# Patient Record
Sex: Female | Born: 1990 | Race: Black or African American | Hispanic: No | Marital: Single | Smoking: Never smoker
Health system: Southern US, Community
[De-identification: ages and names within clinical notes are randomized; demographics above are authoritative.]

## PROBLEM LIST (undated history)

## (undated) ENCOUNTER — Inpatient Hospital Stay (HOSPITAL_COMMUNITY): Payer: Self-pay

## (undated) DIAGNOSIS — Z789 Other specified health status: Secondary | ICD-10-CM

## (undated) DIAGNOSIS — O480 Post-term pregnancy: Secondary | ICD-10-CM

## (undated) DIAGNOSIS — O368390 Maternal care for abnormalities of the fetal heart rate or rhythm, unspecified trimester, not applicable or unspecified: Secondary | ICD-10-CM

## (undated) HISTORY — PX: INDUCED ABORTION: SHX677

## (undated) HISTORY — DX: Maternal care for abnormalities of the fetal heart rate or rhythm, unspecified trimester, not applicable or unspecified: O36.8390

## (undated) HISTORY — DX: Post-term pregnancy: O48.0

## (undated) HISTORY — PX: MOUTH SURGERY: SHX715

---
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
## 2012-12-28 ENCOUNTER — Encounter (HOSPITAL_COMMUNITY): Payer: Self-pay | Admitting: Emergency Medicine

## 2012-12-28 ENCOUNTER — Emergency Department (INDEPENDENT_AMBULATORY_CARE_PROVIDER_SITE_OTHER)
Admission: EM | Admit: 2012-12-28 | Discharge: 2012-12-28 | Disposition: A | Discharge status: Home / Self Care | Payer: BC Managed Care – PPO | Source: Home / Self Care | Attending: Family Medicine | Admitting: Family Medicine

## 2012-12-28 DIAGNOSIS — N946 Dysmenorrhea, unspecified: Secondary | ICD-10-CM

## 2012-12-28 LAB — POCT URINALYSIS DIP (DEVICE)
Bilirubin Urine: NEGATIVE
Hgb urine dipstick: NEGATIVE
Nitrite: NEGATIVE
pH: 5.5 (ref 5.0–8.0)

## 2012-12-28 LAB — POCT PREGNANCY, URINE: Preg Test, Ur: NEGATIVE

## 2012-12-28 NOTE — ED Provider Notes (Signed)
Brooke Mullen is a 22 y.o. female who presents to Urgent Care today for one week of cramping intermittent abdominal pain. Her pains are similar to menstrual cramps. She had a elective abortion about one month ago. She had normal 2 week followup visit. She denies any vaginal discharge or bleeding. She denies any dysuria nausea vomiting or diarrhea. She feels well otherwise. The pain is mild to moderate. She's tried over-the-counter pain medications which have helped a bit.    History reviewed. No pertinent past medical history. History  Substance Use Topics  . Smoking status: Never Smoker   . Smokeless tobacco: Not on file  . Alcohol Use: Yes   ROS as above Medications reviewed. No current facility-administered medications for this encounter.   No current outpatient prescriptions on file.    Exam:  BP 113/80  Pulse 66  Temp(Src) 97.6 F (36.4 C) (Oral)  Resp 16  SpO2 100% Gen: Well NAD HEENT: EOMI,  MMM Lungs: CTABL Nl WOB Heart: RRR no MRG Abd: NABS, NT, ND Exts: Non edematous BL  LE, warm and well perfused.   Results for orders placed during the hospital encounter of 12/28/12 (from the past 24 hour(s))  POCT URINALYSIS DIP (DEVICE)     Status: None   Collection Time    12/28/12  3:50 PM      Result Value Range   Glucose, UA NEGATIVE  NEGATIVE mg/dL   Bilirubin Urine NEGATIVE  NEGATIVE   Ketones, ur NEGATIVE  NEGATIVE mg/dL   Specific Gravity, Urine >=1.030  1.005 - 1.030   Hgb urine dipstick NEGATIVE  NEGATIVE   pH 5.5  5.0 - 8.0   Protein, ur NEGATIVE  NEGATIVE mg/dL   Urobilinogen, UA 0.2  0.0 - 1.0 mg/dL   Nitrite NEGATIVE  NEGATIVE   Leukocytes, UA NEGATIVE  NEGATIVE  POCT PREGNANCY, URINE     Status: None   Collection Time    12/28/12  3:56 PM      Result Value Range   Preg Test, Ur NEGATIVE  NEGATIVE   No results found.  Assessment and Plan: 22 y.o. female with this is likely menstrual cramping following her abortion.  She appears to be well.  Plan to  use NSAIDs for pain control. If her symptoms persist she should followup at [facility] for pelvic ultrasound to look for retained products of conception.  Discussed warning signs or symptoms. Please see discharge instructions. Patient expresses understanding.      Rodolph Bong, MD 12/28/12 [phone]

## 2012-12-28 NOTE — ED Notes (Signed)
Pt c/o lower abd pain onset 1 week Sxs inlucde: intermittent pain that increases w/pressure Reports she just had a recent abortion Denies: inj/trauma, urinary sxs, vag d/c, fevers Alert w/no signs of acute distress.

## 2013-02-01 ENCOUNTER — Other Ambulatory Visit (HOSPITAL_COMMUNITY)
Admission: RE | Admit: 2013-02-01 | Discharge: 2013-02-01 | Disposition: A | Discharge status: Home / Self Care | Payer: BC Managed Care – PPO | Source: Ambulatory Visit | Attending: Family Medicine | Admitting: Family Medicine

## 2013-02-01 ENCOUNTER — Encounter (HOSPITAL_COMMUNITY): Payer: Self-pay | Admitting: Emergency Medicine

## 2013-02-01 ENCOUNTER — Emergency Department (INDEPENDENT_AMBULATORY_CARE_PROVIDER_SITE_OTHER)
Admission: EM | Admit: 2013-02-01 | Discharge: 2013-02-01 | Disposition: A | Discharge status: Home / Self Care | Payer: BC Managed Care – PPO | Source: Home / Self Care | Attending: Family Medicine | Admitting: Family Medicine

## 2013-02-01 DIAGNOSIS — Z113 Encounter for screening for infections with a predominantly sexual mode of transmission: Secondary | ICD-10-CM | POA: Insufficient documentation

## 2013-02-01 DIAGNOSIS — N760 Acute vaginitis: Secondary | ICD-10-CM | POA: Insufficient documentation

## 2013-02-01 DIAGNOSIS — N730 Acute parametritis and pelvic cellulitis: Secondary | ICD-10-CM

## 2013-02-01 LAB — POCT URINALYSIS DIP (DEVICE)
Bilirubin Urine: NEGATIVE
Nitrite: NEGATIVE
Specific Gravity, Urine: >=1.03 (ref 1.005–1.030)
pH: 6.5 (ref 5.0–8.0)

## 2013-02-01 MED ORDER — LIDOCAINE HCL (PF) 1 % IJ SOLN
INTRAMUSCULAR | Status: AC
  Filled 2013-02-01: qty 5

## 2013-02-01 MED ORDER — METRONIDAZOLE 500 MG PO TABS: 500.0000 mg | ORAL_TABLET | Freq: Two times a day (BID) | ORAL | Status: DC

## 2013-02-01 MED ORDER — DOXYCYCLINE HYCLATE 100 MG PO CAPS: 100.0000 mg | ORAL_CAPSULE | Freq: Two times a day (BID) | ORAL | Status: DC

## 2013-02-01 MED ORDER — CEFTRIAXONE SODIUM 1 G IJ SOLR
1.0000 g | Freq: Once | INTRAMUSCULAR | Status: AC
  Administered 2013-02-01: 1 g via INTRAMUSCULAR

## 2013-02-01 MED ORDER — CEFTRIAXONE SODIUM 1 G IJ SOLR
INTRAMUSCULAR | Status: AC
  Filled 2013-02-01: qty 10

## 2013-02-01 NOTE — ED Notes (Signed)
C/o lower pelvic pain x 1wk. States pain with intercourse. Pt also states that on Sunday she noticed blood when urinating.  Possible concerns of std's. No otc meds tried for symptoms. Hx of abortion 9/23.

## 2013-02-01 NOTE — ED Provider Notes (Signed)
CSN: [id number]     Arrival date & time 02/01/13  1558 History   None    Chief Complaint  Patient presents with  . Pelvic Pain    lower pelvic pain x 1wk and pain with intercourse.    (Consider location/radiation/quality/duration/timing/severity/associated sxs/prior Treatment) HPI Comments: 22 year old female presents complaining of vaginal pain, pelvic pain, pain with intercourse, vaginal discharge, hematuria, dysuria. She believe she may have an STD. She also notes that she just had an abortion recently 2 months ago and doesn't know what that could be she reaching. She denies fever, chills, NVD, abdominal pain.  Patient is a 22 y.o. female presenting with pelvic pain.  Pelvic Pain Pertinent negatives include no chest pain, no abdominal pain and no shortness of breath.    History reviewed. No pertinent past medical history. Past Surgical History  Procedure Laterality Date  . Induced abortion     History reviewed. No pertinent family history. History  Substance Use Topics  . Smoking status: Never Smoker   . Smokeless tobacco: Not on file  . Alcohol Use: Yes   OB History   Grav Para Term Preterm Abortions TAB SAB Ect Mult Living                 Review of Systems  Constitutional: Negative for fever and chills.  Eyes: Negative for visual disturbance.  Respiratory: Negative for cough and shortness of breath.   Cardiovascular: Negative for chest pain, palpitations and leg swelling.  Gastrointestinal: Negative for nausea, vomiting and abdominal pain.  Endocrine: Negative for polydipsia and polyuria.  Genitourinary: Positive for dysuria, urgency, frequency, hematuria, vaginal bleeding, vaginal discharge, vaginal pain, pelvic pain and dyspareunia. Negative for flank pain and genital sores.  Musculoskeletal: Negative for arthralgias and myalgias.  Skin: Negative for rash.  Neurological: Negative for dizziness, weakness and light-headedness.    Allergies  Review of patient's  allergies indicates no known allergies.  Home Medications   Current Outpatient Rx  Name  Route  Sig  Dispense  Refill  . doxycycline (VIBRAMYCIN) 100 MG capsule   Oral   Take 1 capsule (100 mg total) by mouth 2 (two) times daily.   28 capsule   0   . metroNIDAZOLE (FLAGYL) 500 MG tablet   Oral   Take 1 tablet (500 mg total) by mouth 2 (two) times daily.   28 tablet   0    BP 125/82  Pulse 63  Temp(Src) 99 F (37.2 C) (Oral)  Resp 17  SpO2 99% Physical Exam  Nursing note and vitals reviewed. Constitutional: She is oriented to person, place, and time. Vital signs are normal. She appears well-developed and well-nourished. No distress.  HENT:  Head: Normocephalic and atraumatic.  Pulmonary/Chest: Effort normal. No respiratory distress.  Abdominal: Soft. She exhibits no mass. There is no tenderness. There is no rebound and no guarding.  Genitourinary: Cervix exhibits motion tenderness. Cervix exhibits no discharge. There is bleeding around the vagina. Vaginal discharge found.  Neurological: She is alert and oriented to person, place, and time. She has normal strength. Coordination normal.  Skin: Skin is warm and dry. No rash noted. She is not diaphoretic.  Psychiatric: She has a normal mood and affect. Judgment normal.    ED Course  Procedures (including critical care time) Labs Review Labs Reviewed  POCT URINALYSIS DIP (DEVICE) - Abnormal; Notable for the following:    Hgb urine dipstick LARGE (*)    Protein, ur 30 (*)    Leukocytes, UA TRACE (*)  All other components within normal limits  URINE CULTURE  RPR  HIV ANTIBODY (ROUTINE TESTING)  POCT PREGNANCY, URINE  CERVICOVAGINAL ANCILLARY ONLY   Imaging Review No results found.    MDM   1. PID (acute pelvic inflammatory disease)    Vaginal pain and cervical motion tenderness, treating for pelvic inflammatory disease. Sending swabs. Followup immediately if any worsening. Sending RPR and HIV as well.     Meds ordered this encounter  Medications  . cefTRIAXone (ROCEPHIN) injection 1 g    Sig:   . metroNIDAZOLE (FLAGYL) 500 MG tablet    Sig: Take 1 tablet (500 mg total) by mouth 2 (two) times daily.    Dispense:  28 tablet    Refill:  0    Order Specific Question:  Supervising Provider    Answer:  Linna Hoff [phone]  . doxycycline (VIBRAMYCIN) 100 MG capsule    Sig: Take 1 capsule (100 mg total) by mouth 2 (two) times daily.    Dispense:  28 capsule    Refill:  0    Order Specific Question:  Supervising Provider    Answer:  Bradd Canary D [[id number]]       Graylon Good, PA-C 02/01/13 1851

## 2013-02-01 NOTE — Discharge Instructions (Signed)
Pelvic Inflammatory Disease °Pelvic inflammatory disease (PID) refers to an infection in some or all of the female organs. The infection can be in the uterus, ovaries, fallopian tubes, or the surrounding tissues in the pelvis. PID can cause abdominal or pelvic pain that comes on suddenly (acute pelvic pain). PID is a serious infection because it can lead to lasting (chronic) pelvic pain or the inability to have children (infertile).  °CAUSES  °The infection is often caused by the normal bacteria found in the vaginal tissues. PID may also be caused by an infection that is spread during sexual contact. PID can also occur following:  °· The birth of a baby.   °· A miscarriage.   °· An abortion.   °· Major pelvic surgery.   °· The use of an intrauterine device (IUD).   °· A sexual assault.   °RISK FACTORS °Certain factors can put a person at higher risk for PID, such as: °· Being younger than 25 years. °· Being sexually active at a young age. °· Using nonbarrier contraception. °· Having multiple sexual partners. °· Having sex with someone who has symptoms of a genital infection. °· Using oral contraception. °Other times, certain behaviors can increase the possibility of getting PID, such as: °· Having sex during your period. °· Using a vaginal douche. °· Having an intrauterine device (IUD) in place. °SYMPTOMS  °· Abdominal or pelvic pain.   °· Fever.   °· Chills.   °· Abnormal vaginal discharge. °· Abnormal uterine bleeding.   °· Unusual pain shortly after finishing your period. °DIAGNOSIS  °Your caregiver will choose some of the following methods to make a diagnosis, such as:  °· Performing a physical exam and history. A pelvic exam typically reveals a very tender uterus and surrounding pelvis.   °· Ordering laboratory tests including a pregnancy test, blood tests, and urine test.  °· Ordering cultures of the vagina and cervix to check for a sexually transmitted infection (STI). °· Performing an ultrasound.    °· Performing a laparoscopic procedure to look inside the pelvis.   °TREATMENT  °· Antibiotic medicines may be prescribed and taken by mouth.   °· Sexual partners may be treated when the infection is caused by a sexually transmitted disease (STD).   °· Hospitalization may be needed to give antibiotics intravenously. °· Surgery may be needed, but this is rare. °It may take weeks until you are completely well. If you are diagnosed with PID, you should also be checked for human immunodeficiency virus (HIV).   °HOME CARE INSTRUCTIONS  °· If given, take your antibiotics as directed. Finish the medicine even if you start to feel better.   °· Only take over-the-counter or prescription medicines for pain, discomfort, or fever as directed by your caregiver.   °· Do not have sexual intercourse until treatment is completed or as directed by your caregiver. If PID is confirmed, your recent sexual partner(s) will need treatment.   °· Keep your follow-up appointments. °SEEK MEDICAL CARE IF:  °· You have increased or abnormal vaginal discharge.   °· You need prescription medicine for your pain.   °· You vomit.   °· You cannot take your medicines.   °· Your partner has an STD.   °SEEK IMMEDIATE MEDICAL CARE IF:  °· You have a fever.   °· You have increased abdominal or pelvic pain.   °· You have chills.   °· You have pain when you urinate.   °· You are not better after 72 hours following treatment.   °MAKE SURE YOU:  °· Understand these instructions. °· Will watch your condition. °· Will get help right away if you are not doing well or get worse. °  pelvic pain.    You have chills.    You have pain when you urinate.    You are not better after 72 hours following treatment.   MAKE SURE YOU:    Understand these instructions.   Will watch your condition.   Will get help right away if you are not doing well or get worse.  Document Released: 03/24/2005 Document Revised: 12/17/2011 Document Reviewed: 03/20/2011  [employer] Patient Information 2014 [employer].

## 2013-02-02 LAB — HIV ANTIBODY (ROUTINE TESTING W REFLEX): HIV: NONREACTIVE

## 2013-02-03 NOTE — ED Provider Notes (Signed)
Medical screening examination/treatment/procedure(s) were performed by a resident physician or non-physician practitioner and as the supervising physician I was immediately available for consultation/collaboration.  Saren Corkern, MD    Amman Bartel S Tamkia Temples, MD 02/03/13 0756 

## 2013-02-04 NOTE — ED Notes (Signed)
Review labs

## 2013-02-08 NOTE — ED Notes (Signed)
GC/Chlamydia neg., Affirm: Candida and Trich neg., Gardnerella pos., HIV/RPR non-reactive. Pt. Adequately treated with Flagyl. Vassie Moselle 02/08/2013

## 2013-03-08 ENCOUNTER — Inpatient Hospital Stay (HOSPITAL_COMMUNITY): Payer: BC Managed Care – PPO

## 2013-03-08 ENCOUNTER — Encounter (HOSPITAL_COMMUNITY): Payer: Self-pay | Admitting: Emergency Medicine

## 2013-03-08 ENCOUNTER — Inpatient Hospital Stay (HOSPITAL_COMMUNITY)
Admission: AD | Admit: 2013-03-08 | Discharge: 2013-03-09 | Disposition: A | Discharge status: Home / Self Care | Payer: BC Managed Care – PPO | Source: Ambulatory Visit | Attending: Obstetrics & Gynecology | Admitting: Obstetrics & Gynecology

## 2013-03-08 ENCOUNTER — Emergency Department (INDEPENDENT_AMBULATORY_CARE_PROVIDER_SITE_OTHER)
Admission: EM | Admit: 2013-03-08 | Discharge: 2013-03-08 | Disposition: A | Discharge status: Home / Self Care | Payer: BC Managed Care – PPO | Source: Home / Self Care | Attending: Emergency Medicine | Admitting: Emergency Medicine

## 2013-03-08 ENCOUNTER — Encounter (HOSPITAL_COMMUNITY): Payer: Self-pay | Admitting: *Deleted

## 2013-03-08 DIAGNOSIS — R52 Pain, unspecified: Secondary | ICD-10-CM

## 2013-03-08 DIAGNOSIS — R109 Unspecified abdominal pain: Secondary | ICD-10-CM | POA: Insufficient documentation

## 2013-03-08 DIAGNOSIS — Z331 Pregnant state, incidental: Secondary | ICD-10-CM

## 2013-03-08 DIAGNOSIS — O26899 Other specified pregnancy related conditions, unspecified trimester: Secondary | ICD-10-CM

## 2013-03-08 DIAGNOSIS — Z3201 Encounter for pregnancy test, result positive: Secondary | ICD-10-CM

## 2013-03-08 DIAGNOSIS — Z3490 Encounter for supervision of normal pregnancy, unspecified, unspecified trimester: Secondary | ICD-10-CM

## 2013-03-08 DIAGNOSIS — O99891 Other specified diseases and conditions complicating pregnancy: Secondary | ICD-10-CM | POA: Insufficient documentation

## 2013-03-08 HISTORY — DX: Other specified health status: Z78.9

## 2013-03-08 LAB — POCT URINALYSIS DIP (DEVICE)
Bilirubin Urine: NEGATIVE
Glucose, UA: NEGATIVE mg/dL
Ketones, ur: NEGATIVE mg/dL
Protein, ur: NEGATIVE mg/dL
Specific Gravity, Urine: 1.025 (ref 1.005–1.030)

## 2013-03-08 LAB — POCT PREGNANCY, URINE: Preg Test, Ur: POSITIVE — AB

## 2013-03-08 LAB — CBC
Hemoglobin: 12.9 g/dL (ref 12.0–15.0)
MCH: 32 pg (ref 26.0–34.0)
MCV: 87.8 fL (ref 78.0–100.0)
Platelets: 184 10*3/uL (ref 150–400)
RBC: 4.03 MIL/uL (ref 3.87–5.11)

## 2013-03-08 LAB — HCG, QUANTITATIVE, PREGNANCY: hCG, Beta Chain, Quant, S: 1604 m[IU]/mL — ABNORMAL HIGH (ref <5)

## 2013-03-08 LAB — WET PREP, GENITAL

## 2013-03-08 MED ORDER — ONDANSETRON 4 MG PO TBDP
ORAL_TABLET | ORAL | Status: AC
  Filled 2013-03-08: qty 1

## 2013-03-08 MED ORDER — SODIUM CHLORIDE 0.9 % IV SOLN: Freq: Once | INTRAVENOUS | Status: DC

## 2013-03-08 MED ORDER — ONDANSETRON 4 MG PO TBDP
4.0000 mg | ORAL_TABLET | Freq: Once | ORAL | Status: AC
  Administered 2013-03-08: 4 mg via ORAL

## 2013-03-08 NOTE — ED Provider Notes (Signed)
Medical screening examination/treatment/procedure(s) were performed by non-physician practitioner and as supervising physician I was immediately available for consultation/collaboration.  Leslee Home, M.D.  Reuben Likes, MD 03/08/13 2219

## 2013-03-08 NOTE — ED Provider Notes (Signed)
CSN: [id number]     Arrival date & time 03/08/13  1935 History   None    Chief Complaint  Patient presents with  . Abdominal Cramping   (Consider location/radiation/quality/duration/timing/severity/associated sxs/prior Treatment) HPI Comments: Patient presents to UC with complaints of 24 hours of lower abdominal pain with associated cramping. Denies urinary symptoms, vaginal bleeding or discharge. LNMP: 3rd week in Oct. 2014. G1P0A1. Denies N/V/D/C or fever.   The history is provided by the patient.    History reviewed. No pertinent past medical history. Past Surgical History  Procedure Laterality Date  . Induced abortion     History reviewed. No pertinent family history. History  Substance Use Topics  . Smoking status: Never Smoker   . Smokeless tobacco: Not on file  . Alcohol Use: Yes   OB History   Grav Para Term Preterm Abortions TAB SAB Ect Mult Living                 Review of Systems  All other systems reviewed and are negative.    Allergies  Review of patient's allergies indicates no known allergies.  Home Medications   Current Outpatient Rx  Name  Route  Sig  Dispense  Refill  . doxycycline (VIBRAMYCIN) 100 MG capsule   Oral   Take 1 capsule (100 mg total) by mouth 2 (two) times daily.   28 capsule   0   . metroNIDAZOLE (FLAGYL) 500 MG tablet   Oral   Take 1 tablet (500 mg total) by mouth 2 (two) times daily.   28 tablet   0    BP 93/53  Pulse 80  Temp(Src) 97.8 F (36.6 C) (Oral)  Resp 16  SpO2 100%  LMP 02/03/2013 Physical Exam  Nursing note and vitals reviewed. Constitutional: She is oriented to person, place, and time. She appears well-developed and well-nourished. No distress.  Eyes: Conjunctivae are normal. No scleral icterus.  Cardiovascular: Normal rate, regular rhythm and normal heart sounds.   Pulmonary/Chest: Effort normal and breath sounds normal.  Abdominal: Soft. Bowel sounds are normal. She exhibits no distension and no mass.  There is tenderness. There is no rebound and no guarding.  Musculoskeletal: Normal range of motion.  Neurological: She is alert and oriented to person, place, and time.  Skin: Skin is warm and dry.  Psychiatric: She has a normal mood and affect. Her behavior is normal.    ED Course  Procedures (including critical care time) Labs Review Labs Reviewed  POCT PREGNANCY, URINE - Abnormal; Notable for the following:    Preg Test, Ur POSITIVE (*)    All other components within normal limits  POCT URINALYSIS DIP (DEVICE)   Imaging Review No results found.  EKG Interpretation    Date/Time:    Ventricular Rate:    PR Interval:    QRS Duration:   QT Interval:    QTC Calculation:   R Axis:     Text Interpretation:              MDM   1. Pregnant   2. Abdominal pain, acute     UA normal. UPT positive. Case discussed with Dr. Lorenz Coaster and patient to be transferred to [facility] for evaluation for ectopic pregnancy.    Jess Barters [place], [place] 03/08/13 2106

## 2013-03-08 NOTE — MAU Note (Signed)
Pt states she has been having abdominal cramping on Saturday pain and cramping came back.

## 2013-03-08 NOTE — ED Notes (Signed)
Reports lower abdominal cramping on set yesterday.  States missed cycle.  Usually has normal cycle. Mild nausea no vomiting. Denies abnormal discharge and bleeding.  Pt has used advil with no relief.

## 2013-03-08 NOTE — MAU Provider Note (Signed)
Chief Complaint: Abdominal Pain   First Provider Initiated Contact with Patient 03/08/13 2158     SUBJECTIVE HPI: Brooke Mullen is a 22 y.o. G2P0010 at [redacted]w[redacted]d by LMP who presents to maternity admissions sent from Urgent Care with abdominal cramping x3 days and positive pregnancy test.  She denies vaginal bleeding, vaginal itching/burning, urinary symptoms, h/a, dizziness, n/v, or fever/chills.    Patient's last menstrual period was 02/03/2013. She is fairly sure of this date and has regular cycles.    Past Medical History  Diagnosis Date  . Medical history non-contributory    Past Surgical History  Procedure Laterality Date  . Induced abortion    . Mouth surgery     History   Social History  . Marital Status: Single    Spouse Name: N/A    Number of Children: N/A  . Years of Education: N/A   Occupational History  . Not on file.   Social History Main Topics  . Smoking status: Never Smoker   . Smokeless tobacco: Not on file  . Alcohol Use: Yes  . Drug Use: No  . Sexual Activity: Yes    Birth Control/ Protection: None   Other Topics Concern  . Not on file   Social History Narrative  . No narrative on file   No current facility-administered medications on file prior to encounter.   No current outpatient prescriptions on file prior to encounter.   No Known Allergies  ROS: Pertinent items in HPI  OBJECTIVE Temperature 98.3 F (36.8 C), temperature source Oral, resp. rate 16, last menstrual period 02/03/2013, SpO2 100.00%. GENERAL: Well-developed, well-nourished female in no acute distress.  HEENT: Normocephalic HEART: normal rate RESP: normal effort ABDOMEN: Soft, non-tender EXTREMITIES: Nontender, no edema NEURO: Alert and oriented SPECULUM EXAM:   LAB RESULTS Results for orders placed during the hospital encounter of 03/08/13 (from the past 24 hour(s))  WET PREP, GENITAL     Status: Abnormal   Collection Time    03/08/13 10:20 PM      Result Value Range    Yeast Wet Prep HPF POC NONE SEEN  NONE SEEN   Trich, Wet Prep NONE SEEN  NONE SEEN   Clue Cells Wet Prep HPF POC NONE SEEN  NONE SEEN   WBC, Wet Prep HPF POC FEW (*) NONE SEEN  HCG, QUANTITATIVE, PREGNANCY     Status: Abnormal   Collection Time    03/08/13 10:55 PM      Result Value Range   hCG, Beta Chain, Quant, S 1604 (*) <5 mIU/mL  CBC     Status: Abnormal   Collection Time    03/08/13 10:55 PM      Result Value Range   WBC 9.5  4.0 - 10.5 K/uL   RBC 4.03  3.87 - 5.11 MIL/uL   Hemoglobin 12.9  12.0 - 15.0 g/dL   HCT 40.9 (*) 81.1 - 91.4 %   MCV 87.8  78.0 - 100.0 fL   MCH 32.0  26.0 - 34.0 pg   MCHC 36.4 (*) 30.0 - 36.0 g/dL   RDW 78.2  95.6 - 21.3 %   Platelets 184  150 - 400 K/uL    IMAGING US Ob Comp Less 14 Wks  03/08/2013   CLINICAL DATA:  Pelvic pain, cramping  EXAM: OBSTETRIC <14 WK [place] AND TRANSVAGINAL OB US  TECHNIQUE: Both transabdominal and transvaginal ultrasound examinations were performed for complete evaluation of the gestation as well as the maternal uterus, adnexal regions, and  pelvic cul-de-sac. Transvaginal technique was performed to assess early pregnancy.  COMPARISON:  None.  FINDINGS: Intrauterine gestational sac: Visualized/normal in shape.  Yolk sac:  Not identified  Embryo:  Not identified  Cardiac Activity: Not applicable  MSD: 3.6 mm   4 w   6  d  [place] EDC: 11/09/2013  Maternal uterus/adnexae: No subchorionic hemorrhage. Grossly normal sonographic appearance to the ovaries. Corpus luteal cyst on the right. No free fluid.  IMPRESSION: Single intrauterine gestation. No embryo at this time, likely due to the early age of the pregnancy. Estimated age of 4 weeks 6 days by mean sac diameter. Recommend ultrasound follow-up.   Electronically Signed   By: Jearld Lesch M.D.   On: 03/08/2013 23:51   US Ob Transvaginal  03/08/2013   CLINICAL DATA:  Pelvic pain, cramping  EXAM: OBSTETRIC <14 WK [place] AND TRANSVAGINAL OB US  TECHNIQUE: Both transabdominal and  transvaginal ultrasound examinations were performed for complete evaluation of the gestation as well as the maternal uterus, adnexal regions, and pelvic cul-de-sac. Transvaginal technique was performed to assess early pregnancy.  COMPARISON:  None.  FINDINGS: Intrauterine gestational sac: Visualized/normal in shape.  Yolk sac:  Not identified  Embryo:  Not identified  Cardiac Activity: Not applicable  MSD: 3.6 mm   4 w   6  d  [place] EDC: 11/09/2013  Maternal uterus/adnexae: No subchorionic hemorrhage. Grossly normal sonographic appearance to the ovaries. Corpus luteal cyst on the right. No free fluid.  IMPRESSION: Single intrauterine gestation. No embryo at this time, likely due to the early age of the pregnancy. Estimated age of 4 weeks 6 days by mean sac diameter. Recommend ultrasound follow-up.   Electronically Signed   By: Jearld Lesch M.D.   On: 03/08/2013 23:51    ASSESSMENT 1. Positive pregnancy test   2. Abdominal pain in pregnancy     PLAN Discharge home with ectopic precautions Return to MAU in 48 hours for repeat quant hcg Return to MAU sooner as needed   Follow-up Information   Follow up with [facility]. (Return on Thursday 03/11/13 after 9 pm for repeat labs.  Return sooner as needed.)    Contact information:   [address] [id number] [place] [address] [phone]      Sharen Counter Certified Nurse-Midwife 03/09/2013  12:26 AM

## 2013-03-09 DIAGNOSIS — Z3201 Encounter for pregnancy test, result positive: Secondary | ICD-10-CM

## 2013-03-09 LAB — GC/CHLAMYDIA PROBE AMP: CT Probe RNA: NEGATIVE

## 2013-03-10 ENCOUNTER — Inpatient Hospital Stay (HOSPITAL_COMMUNITY)
Admission: AD | Admit: 2013-03-10 | Discharge: 2013-03-11 | Disposition: A | Discharge status: Home / Self Care | Payer: BC Managed Care – PPO | Source: Ambulatory Visit | Attending: Obstetrics and Gynecology | Admitting: Obstetrics and Gynecology

## 2013-03-10 ENCOUNTER — Encounter (HOSPITAL_COMMUNITY): Payer: Self-pay | Admitting: *Deleted

## 2013-03-10 DIAGNOSIS — R109 Unspecified abdominal pain: Secondary | ICD-10-CM | POA: Insufficient documentation

## 2013-03-10 DIAGNOSIS — O26899 Other specified pregnancy related conditions, unspecified trimester: Secondary | ICD-10-CM

## 2013-03-10 DIAGNOSIS — O99891 Other specified diseases and conditions complicating pregnancy: Secondary | ICD-10-CM | POA: Insufficient documentation

## 2013-03-10 LAB — HCG, QUANTITATIVE, PREGNANCY: hCG, Beta Chain, Quant, S: 3843 m[IU]/mL — ABNORMAL HIGH (ref <5)

## 2013-03-10 NOTE — MAU Note (Signed)
Pt G3 P0 at 5wks for repeat BHCG.  Pt denies pain or bleeding.

## 2013-03-11 LAB — ABO/RH: ABO/RH(D): O NEG

## 2013-03-11 NOTE — MAU Provider Note (Signed)
History   Chief Complaint:  Follow-up   Brooke Mullen is  22 y.o. G3P0010 Patient's last menstrual period was 02/03/2013.Marland Kitchen Patient is here for follow up of quantitative HCG and ongoing surveillance of pregnancy status.   She is [redacted]w[redacted]d weeks gestation  by LMP.    Since her last visit, the patient is without new complaint.   The patient reports bleeding as  none now.  Denies pain.   General ROS:  negative  Her previous Quantitative HCG values are:  03/08/2013: 1604   Physical Exam   Blood pressure 110/72, pulse 85, temperature 98.6 F (37 C), temperature source Oral, resp. rate 16, height 5\' 3"  (1.6 m), weight 73.392 kg (161 lb 12.8 oz), last menstrual period 02/03/2013.  Focused Gynecological Exam: examination not indicated  Labs: Results for orders placed during the hospital encounter of 03/10/13 (from the past 24 hour(s))  HCG, QUANTITATIVE, PREGNANCY   Collection Time    03/10/13 10:47 PM      Result Value Range   hCG, Beta Chain, Quant, S 3843 (*) <5 mIU/mL  ABO/RH   Collection Time    03/10/13 10:47 PM      Result Value Range   ABO/RH(D) O NEG      Ultrasound Studies:   US Ob Comp Less 14 Wks  03/08/2013   CLINICAL DATA:  Pelvic pain, cramping  EXAM: OBSTETRIC <14 WK [place] AND TRANSVAGINAL OB US  TECHNIQUE: Both transabdominal and transvaginal ultrasound examinations were performed for complete evaluation of the gestation as well as the maternal uterus, adnexal regions, and pelvic cul-de-sac. Transvaginal technique was performed to assess early pregnancy.  COMPARISON:  None.  FINDINGS: Intrauterine gestational sac: Visualized/normal in shape.  Yolk sac:  Not identified  Embryo:  Not identified  Cardiac Activity: Not applicable  MSD: 3.6 mm   4 w   6  d  [place] EDC: 11/09/2013  Maternal uterus/adnexae: No subchorionic hemorrhage. Grossly normal sonographic appearance to the ovaries. Corpus luteal cyst on the right. No free fluid.  IMPRESSION: Single intrauterine gestation. No embryo  at this time, likely due to the early age of the pregnancy. Estimated age of 4 weeks 6 days by mean sac diameter. Recommend ultrasound follow-up.   Electronically Signed   By: Jearld Lesch M.D.   On: 03/08/2013 23:51   US Ob Transvaginal  03/08/2013   CLINICAL DATA:  Pelvic pain, cramping  EXAM: OBSTETRIC <14 WK [place] AND TRANSVAGINAL OB US  TECHNIQUE: Both transabdominal and transvaginal ultrasound examinations were performed for complete evaluation of the gestation as well as the maternal uterus, adnexal regions, and pelvic cul-de-sac. Transvaginal technique was performed to assess early pregnancy.  COMPARISON:  None.  FINDINGS: Intrauterine gestational sac: Visualized/normal in shape.  Yolk sac:  Not identified  Embryo:  Not identified  Cardiac Activity: Not applicable  MSD: 3.6 mm   4 w   6  d  [place] EDC: 11/09/2013  Maternal uterus/adnexae: No subchorionic hemorrhage. Grossly normal sonographic appearance to the ovaries. Corpus luteal cyst on the right. No free fluid.  IMPRESSION: Single intrauterine gestation. No embryo at this time, likely due to the early age of the pregnancy. Estimated age of 4 weeks 6 days by mean sac diameter. Recommend ultrasound follow-up.   Electronically Signed   By: Jearld Lesch M.D.   On: 03/08/2013 23:51    Assessment:  [redacted]w[redacted]d weeks gestation  W/ appropriate rise in quants.   Plan: Could not wait for results. Too late to call back.  Message sent to clinic to call pt w/ normal rise in quants and recommend F/U US in 2 weeks or MAU PRN.   Lynnann Knudsen 03/11/2013, 12:18 AM

## 2013-03-14 ENCOUNTER — Telehealth: Payer: Self-pay | Admitting: *Deleted

## 2013-03-14 NOTE — MAU Provider Note (Signed)
Attestation of Attending Supervision of Advanced Practitioner (CNM/NP): Evaluation and management procedures were performed by the Advanced Practitioner under my supervision and collaboration.  I have reviewed the Advanced Practitioner's note and chart, and I agree with the management and plan.  Giavanna Kang 03/14/2013 11:24 AM

## 2013-03-14 NOTE — Telephone Encounter (Signed)
Message copied by Dorothyann Peng on Mon Mar 14, 2013  3:49 PM ------      Message from: [place], [place]      Created: Fri Mar 11, 2013  3:10 AM       Left MAU prior to Quant results. Please inform pt of normal rise and recommendation for viability [place] at [facility] [place] in 2 weeks or MAU PRN.  ------

## 2013-03-14 NOTE — Telephone Encounter (Signed)
Spoke with patient concerning note by Ivonne Andrew. Pt states she will return to MAU in two weeks for ultrasound for viability.

## 2013-04-07 NOTE — L&D Delivery Note (Signed)
Delivery Note At 4:43 AM a viable and healthy female was delivered via Vaginal, Spontaneous Delivery (Presentation: ROA).  APGAR: 9, 9; weight 8 lb 0.9 oz (3655 g).   Placenta status: Intact, Spontaneous.  Cord: 3 vessels with the following complications: Nuchal cord  No difficulty with shoulders  Anesthesia: Epidural  Episiotomy: None Lacerations:   Suture Repair: 3.0 monocryl Est. Blood Loss (mL):  200   Mom to postpartum.  Baby to Couplet care / Skin to Skin.  [facility] LLCWILLIAMS,Yoniel Arkwright 04/04/2014, 5:44 AM

## 2013-06-22 ENCOUNTER — Encounter (HOSPITAL_BASED_OUTPATIENT_CLINIC_OR_DEPARTMENT_OTHER): Payer: Self-pay | Admitting: Emergency Medicine

## 2013-06-22 ENCOUNTER — Emergency Department (HOSPITAL_BASED_OUTPATIENT_CLINIC_OR_DEPARTMENT_OTHER)
Admission: EM | Admit: 2013-06-22 | Discharge: 2013-06-22 | Disposition: A | Discharge status: Home / Self Care | Payer: BC Managed Care – PPO | Attending: Emergency Medicine | Admitting: Emergency Medicine

## 2013-06-22 DIAGNOSIS — Z3202 Encounter for pregnancy test, result negative: Secondary | ICD-10-CM | POA: Insufficient documentation

## 2013-06-22 DIAGNOSIS — N946 Dysmenorrhea, unspecified: Secondary | ICD-10-CM | POA: Insufficient documentation

## 2013-06-22 LAB — PREGNANCY, URINE: PREG TEST UR: NEGATIVE

## 2013-06-22 MED ORDER — ONDANSETRON 4 MG PO TBDP
4.0000 mg | ORAL_TABLET | Freq: Once | ORAL | Status: AC
  Administered 2013-06-22: 4 mg via ORAL
  Filled 2013-06-22: qty 1

## 2013-06-22 MED ORDER — ONDANSETRON 4 MG PO TBDP: 4.0000 mg | ORAL_TABLET | Freq: Three times a day (TID) | ORAL | Status: DC | PRN

## 2013-06-22 MED ORDER — KETOROLAC TROMETHAMINE 60 MG/2ML IM SOLN
60.0000 mg | Freq: Once | INTRAMUSCULAR | Status: AC
  Administered 2013-06-22: 60 mg via INTRAMUSCULAR
  Filled 2013-06-22: qty 2

## 2013-06-22 MED ORDER — NAPROXEN 500 MG PO TABS: 500.0000 mg | ORAL_TABLET | Freq: Two times a day (BID) | ORAL | Status: DC

## 2013-06-22 NOTE — ED Provider Notes (Signed)
CSN: [id number]     Arrival date & time 06/22/13  4098 History  This chart was scribed for Rolland Porter, MD by Ellin Mayhew, ED Scribe. This patient was seen in room MH08/MH08 and the patient's care was started at 7:54 PM.   Chief Complaint  Patient presents with  . Dysmenorrhea   HPI HPI Comments: Brooke Mullen is a 23 y.o. female who presents to the Emergency Department with a chief complaint of dysmenorrhea, with onset, several months. She characterizes the pain as constant during the first two days of menstruation and localized to the lower abdominal region, bilaterally, with radiation to the low back and thighs, bilaterally. Patients states that during the first two days of her menstrual period, she typically sees heavier bleeding and severe suprapubic cramps with associated nausea and vomiting. Patient reports having vomited three times today. She states her pain has caused her to miss school several times. Patient states she is currently on her first day of her menstrual period. She denies taking any birth control or hormones. She reports regular periods occurring once/month.   Past Medical History  Diagnosis Date  . Medical history non-contributory    Past Surgical History  Procedure Laterality Date  . Induced abortion    . Mouth surgery     History reviewed. No pertinent family history. History  Substance Use Topics  . Smoking status: Never Smoker   . Smokeless tobacco: Not on file  . Alcohol Use: Yes   OB History   Grav Para Term Preterm Abortions TAB SAB Ect Mult Living   3 0 0 0 1 1 0 0 0 0      Review of Systems  Constitutional: Negative for fever, chills, diaphoresis, appetite change and fatigue.  HENT: Negative for mouth sores, sore throat and trouble swallowing.   Eyes: Negative for visual disturbance.  Respiratory: Negative for cough, chest tightness, shortness of breath and wheezing.   Cardiovascular: Negative for chest pain.  Gastrointestinal: Positive for  abdominal pain. Negative for nausea, vomiting, diarrhea and abdominal distention.  Endocrine: Negative for polydipsia, polyphagia and polyuria.  Genitourinary: Positive for menstrual problem. Negative for dysuria, frequency and hematuria.  Musculoskeletal: Negative for gait problem.  Skin: Negative for color change, pallor and rash.  Neurological: Negative for dizziness, syncope, light-headedness and headaches.  Hematological: Does not bruise/bleed easily.  Psychiatric/Behavioral: Negative for behavioral problems and confusion.      Allergies  Review of patient's allergies indicates no known allergies.  Home Medications   Current Outpatient Rx  Name  Route  Sig  Dispense  Refill  . naproxen (NAPROSYN) 500 MG tablet   Oral   Take 1 tablet (500 mg total) by mouth 2 (two) times daily.   30 tablet   0     Start 24 hours before your period   . ondansetron (ZOFRAN ODT) 4 MG disintegrating tablet   Oral   Take 1 tablet (4 mg total) by mouth every 8 (eight) hours as needed for nausea.   20 tablet   0     Triage Vitals: BP 124/89  Pulse 74  Temp(Src) 97.9 F (36.6 C) (Oral)  Resp 18  SpO2 100%  LMP 06/20/2013  Breastfeeding? Unknown  Physical Exam  Constitutional: She is oriented to person, place, and time. She appears well-developed and well-nourished. No distress.  HENT:  Head: Normocephalic.  Eyes: Conjunctivae are normal. Pupils are equal, round, and reactive to light. No scleral icterus.  Neck: Normal range of motion. Neck supple.  No thyromegaly present.  Cardiovascular: Normal rate and regular rhythm.  Exam reveals no gallop and no friction rub.   No murmur heard. Pulmonary/Chest: Effort normal and breath sounds normal. No respiratory distress. She has no wheezes. She has no rales.  Abdominal: Soft. Bowel sounds are normal. She exhibits no distension and no mass. There is no tenderness. There is no rebound and no guarding.  Patient points to suprapubic area for pain  - not TTP.  Musculoskeletal: Normal range of motion.  Neurological: She is alert and oriented to person, place, and time.  Skin: Skin is warm and dry. No rash noted.  Psychiatric: She has a normal mood and affect. Her behavior is normal.    ED Course  Procedures (including critical care time)  DIAGNOSTIC STUDIES: Oxygen Saturation is 100% on room air, normal by my interpretation.    COORDINATION OF CARE: 7:56 PM-Anti-nausea medication ordered, pregnancy test ordered and will F/U with patient following UA results. Treatment plan discussed with patient and patient agrees.  Labs Review Labs Reviewed  PREGNANCY, URINE   Imaging Review No results found.   EKG Interpretation None      MDM   Final diagnoses:  Dysmenorrhea     I personally performed the services described in this documentation, which was scribed in my presence. The recorded information has been reviewed and is accurate.    EKG Interpretation None          Rolland PorterMark Jameel Quant, MD 06/22/13 2020

## 2013-06-22 NOTE — Discharge Instructions (Signed)

## 2013-06-22 NOTE — ED Notes (Signed)
Pt reports menstral cramping that causes nausea.

## 2013-06-22 NOTE — ED Notes (Signed)
States started period this am w severe abd cramping and pain  Vomited x 3  At present relaxed and texting

## 2013-08-03 ENCOUNTER — Encounter (HOSPITAL_BASED_OUTPATIENT_CLINIC_OR_DEPARTMENT_OTHER): Payer: Self-pay | Admitting: Emergency Medicine

## 2013-08-03 ENCOUNTER — Emergency Department (HOSPITAL_BASED_OUTPATIENT_CLINIC_OR_DEPARTMENT_OTHER)
Admission: EM | Admit: 2013-08-03 | Discharge: 2013-08-04 | Disposition: A | Discharge status: Home / Self Care | Payer: BC Managed Care – PPO | Attending: Emergency Medicine | Admitting: Emergency Medicine

## 2013-08-03 DIAGNOSIS — M549 Dorsalgia, unspecified: Secondary | ICD-10-CM | POA: Insufficient documentation

## 2013-08-03 DIAGNOSIS — Z791 Long term (current) use of non-steroidal anti-inflammatories (NSAID): Secondary | ICD-10-CM | POA: Insufficient documentation

## 2013-08-03 DIAGNOSIS — K5900 Constipation, unspecified: Secondary | ICD-10-CM | POA: Insufficient documentation

## 2013-08-03 DIAGNOSIS — O210 Mild hyperemesis gravidarum: Secondary | ICD-10-CM | POA: Insufficient documentation

## 2013-08-03 DIAGNOSIS — O9989 Other specified diseases and conditions complicating pregnancy, childbirth and the puerperium: Secondary | ICD-10-CM | POA: Insufficient documentation

## 2013-08-03 DIAGNOSIS — O219 Vomiting of pregnancy, unspecified: Secondary | ICD-10-CM

## 2013-08-03 LAB — URINALYSIS, ROUTINE W REFLEX MICROSCOPIC
Bilirubin Urine: NEGATIVE
GLUCOSE, UA: NEGATIVE mg/dL
Hgb urine dipstick: NEGATIVE
KETONES UR: NEGATIVE mg/dL
LEUKOCYTES UA: NEGATIVE
Nitrite: NEGATIVE
PH: 6 (ref 5.0–8.0)
Protein, ur: NEGATIVE mg/dL
SPECIFIC GRAVITY, URINE: 1.028 (ref 1.005–1.030)
Urobilinogen, UA: 1 mg/dL (ref 0.0–1.0)

## 2013-08-03 LAB — PREGNANCY, URINE: Preg Test, Ur: POSITIVE — AB

## 2013-08-03 MED ORDER — ONDANSETRON 4 MG PO TBDP
4.0000 mg | ORAL_TABLET | Freq: Once | ORAL | Status: AC
  Administered 2013-08-03: 4 mg via ORAL
  Filled 2013-08-03: qty 1

## 2013-08-03 NOTE — ED Provider Notes (Signed)
CSN: [id number]     Arrival date & time 08/03/13  2258 History  This chart was scribed for Brooke Mullen [name], MD by Luisa DagoPriscilla Tutu, ED Scribe. This patient was seen in room MH10/MH10 and the patient's care was started at 11:43 PM.    Chief Complaint  Patient presents with  . Vomiting     The history is provided by the patient. No language interpreter was used.   HPI Comments: Brooke Mullen is a 23 y.o. female who presents to the Emergency Department complaining of nausea and vomiting for the past several days. Symptoms episodes occur in the mornings and evenings. Pt states that she had an 2 episodes of emesis upon waking today. She is also complaining of mild constipation, back pain and abdominal cramping. Ms. [name]'s LNMP was 06/24/13. Pt states that she does not anticipate pregnancy because she's used protection with her last sexual partner. She denies any vaginal bleeding, vaginal discharge, hematuria, dysuria, fever, chills or diaphoresis.   Past Medical History  Diagnosis Date  . Medical history non-contributory    Past Surgical History  Procedure Laterality Date  . Induced abortion    . Mouth surgery     History reviewed. No pertinent family history. History  Substance Use Topics  . Smoking status: Never Smoker   . Smokeless tobacco: Not on file  . Alcohol Use: Yes   OB History   Grav Para Term Preterm Abortions TAB SAB Ect Mult Living   3 0 0 0 1 1 0 0 0 0      Review of Systems A complete 10 system review of systems was obtained and all systems are negative except as noted in the HPI and PMH.     Allergies  Review of patient's allergies indicates no known allergies.  Home Medications   Prior to Admission medications   Medication Sig Start Date End Date Taking? Authorizing Provider  naproxen (NAPROSYN) 500 MG tablet Take 1 tablet (500 mg total) by mouth 2 (two) times daily. 06/22/13   Rolland PorterMark James, MD  ondansetron (ZOFRAN ODT) 4 MG disintegrating tablet Take 1 tablet (4  mg total) by mouth every 8 (eight) hours as needed for nausea. 06/22/13   Rolland PorterMark James, MD   BP 117/77  Pulse 106  Temp(Src) 99.8 F (37.7 C) (Oral)  Resp 16  Ht 5\' 3"  (1.6 m)  Wt 147 lb (66.679 kg)  BMI 26.05 kg/m2  SpO2 99%  LMP 06/24/2013  Breastfeeding? No   Physical Exam  Nursing note and vitals reviewed. General: Well-developed, well-nourished female in no acute distress; appearance consistent with age of record HENT: normocephalic; atraumatic Eyes: pupils equal, round and reactive to light; extraocular muscles intact Neck: supple Heart: regular rate and rhythm; no murmurs, rubs or gallops Lungs: clear to auscultation bilaterally Abdomen: soft; nondistended; mild diffused abdominal tenderness; no masses or hepatosplenomegaly; bowel sounds present Back: lower back tenderness Extremities: No deformity; full range of motion; pulses normal Neurologic: Awake, alert and oriented; motor function intact in all extremities and symmetric; no facial droop Skin: Warm and dry Psychiatric: Normal mood and affect  ED Course  Procedures (including critical care time)  DIAGNOSTIC STUDIES: Oxygen Saturation is 99% on RA, normal by my interpretation.    COORDINATION OF CARE: 11:49 PM- Pt advised of plan for treatment and pt agrees.   MDM   Nursing notes and vitals signs, including pulse oximetry, reviewed.  Summary of this visit's results, reviewed by myself:  Labs:  Results for orders placed  during the hospital encounter of 08/03/13 (from the past 24 hour(s))  URINALYSIS, ROUTINE W REFLEX MICROSCOPIC     Status: None   Collection Time    08/03/13 11:00 PM      Result Value Ref Range   Color, Urine YELLOW  YELLOW   APPearance CLEAR  CLEAR   Specific Gravity, Urine 1.028  1.005 - 1.030   pH 6.0  5.0 - 8.0   Glucose, UA NEGATIVE  NEGATIVE mg/dL   Hgb urine dipstick NEGATIVE  NEGATIVE   Bilirubin Urine NEGATIVE  NEGATIVE   Ketones, ur NEGATIVE  NEGATIVE mg/dL   Protein, ur  NEGATIVE  NEGATIVE mg/dL   Urobilinogen, UA 1.0  0.0 - 1.0 mg/dL   Nitrite NEGATIVE  NEGATIVE   Leukocytes, UA NEGATIVE  NEGATIVE  PREGNANCY, URINE     Status: Abnormal   Collection Time    08/03/13 11:00 PM      Result Value Ref Range   Preg Test, Ur POSITIVE (*) NEGATIVE   I personally performed the services described in this documentation, which was scribed in my presence. The recorded information has been reviewed and is accurate.   Brooke Mullen Jeremiah Curci, MD 08/04/13 [phone]

## 2013-08-03 NOTE — ED Notes (Signed)
Pt c/o n/v LMP 3/20

## 2013-08-03 NOTE — ED Notes (Signed)
C/o n/v off and on x 1 week,  Denies vag dc or burning w urination  lmp middle of march

## 2013-08-04 MED ORDER — PRENATAL PLUS 27-1 MG PO TABS: 1.0000 | ORAL_TABLET | Freq: Every day | ORAL | Status: DC

## 2013-08-04 MED ORDER — ONDANSETRON 4 MG PO TBDP
4.0000 mg | ORAL_TABLET | Freq: Three times a day (TID) | ORAL | Status: DC | PRN
Start: 2013-08-04 — End: 2013-10-05

## 2013-08-04 NOTE — Discharge Instructions (Signed)
Morning Sickness °Morning sickness is when you feel sick to your stomach (nauseous) during pregnancy. This nauseous feeling may or may not come with vomiting. It often occurs in the morning but can be a problem any time of day. Morning sickness is most common during the first trimester, but it may continue throughout pregnancy. While morning sickness is unpleasant, it is usually harmless unless you develop severe and continual vomiting (hyperemesis gravidarum). This condition requires more intense treatment.  °CAUSES  °The cause of morning sickness is not completely known but seems to be related to normal hormonal changes that occur in pregnancy. °RISK FACTORS °You are at greater risk if you: °· Experienced nausea or vomiting before your pregnancy. °· Had morning sickness during a previous pregnancy. °· Are pregnant with more than one baby, such as twins. °TREATMENT  °Do not use any medicines (prescription, over-the-counter, or herbal) for morning sickness without first talking to your health care provider. Your health care provider may prescribe or recommend: °· Vitamin B6 supplements. °· Anti-nausea medicines. °· The herbal medicine ginger. °HOME CARE INSTRUCTIONS  °· Only take over-the-counter or prescription medicines as directed by your health care provider. °· Taking multivitamins before getting pregnant can prevent or decrease the severity of morning sickness in most women.   °· Eat a piece of dry toast or unsalted crackers before getting out of bed in the morning.   °· Eat five or six small meals a day.   °· Eat dry and bland foods (rice, baked potato ). Foods high in carbohydrates are often helpful.  °· Do not drink liquids with your meals. Drink liquids between meals.   °· Avoid greasy, fatty, and spicy foods.   °· Get someone to cook for you if the smell of any food causes nausea and vomiting.   °· If you feel nauseous after taking prenatal vitamins, take the vitamins at night or with a snack.  °· Snack  on protein foods (nuts, yogurt, cheese) between meals if you are hungry.   °· Eat unsweetened gelatins for desserts.   °· Wearing an acupressure wristband (worn for sea sickness) may be helpful.   °· Acupuncture may be helpful.   °· Do not smoke.   °· Get a humidifier to keep the air in your house free of odors.   °· Get plenty of fresh air. °SEEK MEDICAL CARE IF:  °· Your home remedies are not working, and you need medicine. °· You feel dizzy or lightheaded. °· You are losing weight. °SEEK IMMEDIATE MEDICAL CARE IF:  °· You have persistent and uncontrolled nausea and vomiting. °· You pass out (faint). °Document Released: 05/15/2006 Document Revised: 11/24/2012 Document Reviewed: 09/08/2012 °Exit[employer]® Patient Information ©2014 [employer]. ° °

## 2013-09-07 ENCOUNTER — Encounter: Payer: Self-pay | Admitting: Advanced Practice Midwife

## 2013-09-07 ENCOUNTER — Ambulatory Visit (INDEPENDENT_AMBULATORY_CARE_PROVIDER_SITE_OTHER): Payer: BC Managed Care – PPO | Admitting: Advanced Practice Midwife

## 2013-09-07 VITALS — BP 112/77 | HR 83 | Temp 98.0°F | Wt 155.0 lb

## 2013-09-07 DIAGNOSIS — Z3490 Encounter for supervision of normal pregnancy, unspecified, unspecified trimester: Secondary | ICD-10-CM

## 2013-09-07 DIAGNOSIS — Z3480 Encounter for supervision of other normal pregnancy, unspecified trimester: Secondary | ICD-10-CM

## 2013-09-07 LAB — OB RESULTS CONSOLE GC/CHLAMYDIA
Chlamydia: NEGATIVE
Gonorrhea: NEGATIVE

## 2013-09-07 LAB — POCT URINALYSIS DIP (DEVICE)
BILIRUBIN URINE: NEGATIVE
GLUCOSE, UA: NEGATIVE mg/dL
Ketones, ur: NEGATIVE mg/dL
NITRITE: NEGATIVE
Protein, ur: NEGATIVE mg/dL
Specific Gravity, Urine: >=1.03 (ref 1.005–1.030)
Urobilinogen, UA: 0.2 mg/dL (ref 0.0–1.0)
pH: 5.5 (ref 5.0–8.0)

## 2013-09-07 MED ORDER — PROMETHAZINE HCL 25 MG PO TABS: 12.5000 mg | ORAL_TABLET | Freq: Four times a day (QID) | ORAL | Status: DC | PRN

## 2013-09-07 NOTE — Progress Notes (Signed)
   Subjective:    Brooke Mullen is a [age] [redacted]w[redacted]d being seen today for her first obstetrical visit.  Her obstetrical history is significant for 2 previous TABs. Patient does intend to breast feed. Pregnancy history fully reviewed.  Patient reports nausea and vomiting.  Filed Vitals:   09/07/13 1049  BP: 112/77  Pulse: 83  Temp: 98 F (36.7 C)  Weight: 155 lb (70.308 kg)    HISTORY: OB History  Gravida Para Term Preterm AB SAB TAB Ectopic Multiple Living  3 0 0 0 2 0 2 0 0 0     # Outcome Date GA Lbr Len/2nd Weight Sex Delivery Anes PTL Lv  3 CUR           2 TAB 03/2013          1 TAB 2014             Comments: System Generated. Please review and update pregnancy details.     Past Medical History  Diagnosis Date  . Medical history non-contributory    Past Surgical History  Procedure Laterality Date  . Induced abortion    . Mouth surgery     History reviewed. No pertinent family history.   Exam    Uterus:   11weeks by LMP  Pelvic Exam:    Perineum: No Hemorrhoids, Normal Perineum   Vulva: normal   Vagina:  normal mucosa, normal discharge   pH:    Cervix: no cervical motion tenderness   Adnexa: normal adnexa and no mass, fullness, tenderness   Bony Pelvis: average  System: Breast:  normal appearance, no masses or tenderness   Skin: normal coloration and turgor, no rashes    Neurologic: oriented, normal, gait normal; reflexes normal and symmetric   Extremities: ROM of all joints is normal   HEENT neck supple with midline trachea and thyroid without masses   Mouth/Teeth mucous membranes moist, pharynx normal without lesions and dental hygiene good   Neck supple and no masses   Cardiovascular: regular rate and rhythm   Respiratory:  appears well, vitals normal, no respiratory distress, acyanotic, normal RR, ear and throat exam is normal, neck free of mass or lymphadenopathy, chest clear, no wheezing, crepitations, rhonchi, normal symmetric air entry   Abdomen: soft, non-tender; bowel sounds normal; no masses,  no organomegaly   Urinary: urethral meatus normal      Assessment:    Pregnancy: G3P0020 There are no active problems to display for this patient.       Plan:     Initial labs drawn. Prenatal vitamins. Problem list reviewed and updated. Genetic Screening discussed First Screen: ordered.  Ultrasound discussed; fetal survey: requested.  Follow up in 4 weeks. 50% of 30 min visit spent on counseling and coordination of care.     Wilmer Floor Leftwich-Kirby 09/07/2013

## 2013-09-07 NOTE — Progress Notes (Signed)
Initial prenatal labs, pt reports vomiting several times per day. Education new ob packet given.

## 2013-09-07 NOTE — Patient Instructions (Signed)
Nausea medication to take during pregnancy:   Unisom (doxylamine succinate 25 mg tablets) Take one tablet daily at bedtime. If symptoms are not adequately controlled, the dose can be increased to a maximum recommended dose of two tablets daily (1/2 tablet in the morning, 1/2 tablet mid-afternoon and one at bedtime).  Vitamin B6 100mg tablets. Take one tablet twice a day (up to 200 mg per day).  Add Phenergan as prescribed to take as needed.  

## 2013-09-08 LAB — OBSTETRIC PANEL
ANTIBODY SCREEN: NEGATIVE
Basophils Absolute: 0 10*3/uL (ref 0.0–0.1)
Basophils Relative: 0 % (ref 0–1)
Eosinophils Absolute: 0 10*3/uL (ref 0.0–0.7)
Eosinophils Relative: 0 % (ref 0–5)
HEMATOCRIT: 37.7 % (ref 36.0–46.0)
HEMOGLOBIN: 13.5 g/dL (ref 12.0–15.0)
Hepatitis B Surface Ag: NEGATIVE
LYMPHS ABS: 1.8 10*3/uL (ref 0.7–4.0)
LYMPHS PCT: 26 % (ref 12–46)
MCH: 31.5 pg (ref 26.0–34.0)
MCHC: 35.8 g/dL (ref 30.0–36.0)
MCV: 87.9 fL (ref 78.0–100.0)
MONO ABS: 0.7 10*3/uL (ref 0.1–1.0)
MONOS PCT: 10 % (ref 3–12)
NEUTROS ABS: 4.5 10*3/uL (ref 1.7–7.7)
Neutrophils Relative %: 64 % (ref 43–77)
Platelets: 213 10*3/uL (ref 150–400)
RBC: 4.29 MIL/uL (ref 3.87–5.11)
RDW: 13.9 % (ref 11.5–15.5)
RH TYPE: NEGATIVE
RUBELLA: 0.64 {index} (ref <0.90)
WBC: 7.1 10*3/uL (ref 4.0–10.5)

## 2013-09-08 LAB — HIV ANTIBODY (ROUTINE TESTING W REFLEX): HIV 1&2 Ab, 4th Generation: NONREACTIVE

## 2013-09-08 LAB — GC/CHLAMYDIA PROBE AMP
CT Probe RNA: NEGATIVE
GC Probe RNA: NEGATIVE

## 2013-09-09 DIAGNOSIS — Z3490 Encounter for supervision of normal pregnancy, unspecified, unspecified trimester: Secondary | ICD-10-CM

## 2013-09-09 HISTORY — DX: Encounter for supervision of normal pregnancy, unspecified, unspecified trimester: Z34.90

## 2013-09-09 LAB — PRESCRIPTION MONITORING PROFILE (19 PANEL)
AMPHETAMINE/METH: NEGATIVE ng/mL
BARBITURATE SCREEN, URINE: NEGATIVE ng/mL
Benzodiazepine Screen, Urine: NEGATIVE ng/mL
Buprenorphine, Urine: NEGATIVE ng/mL
CANNABINOID SCRN UR: NEGATIVE ng/mL
CREATININE, URINE: 285.69 mg/dL (ref >20.0)
Carisoprodol, Urine: NEGATIVE ng/mL
Cocaine Metabolites: NEGATIVE ng/mL
Fentanyl, Ur: NEGATIVE ng/mL
MDMA URINE: NEGATIVE ng/mL
Meperidine, Ur: NEGATIVE ng/mL
Methadone Screen, Urine: NEGATIVE ng/mL
Methaqualone: NEGATIVE ng/mL
Nitrites, Initial: NEGATIVE ug/mL
Opiate Screen, Urine: NEGATIVE ng/mL
Oxycodone Screen, Ur: NEGATIVE ng/mL
Phencyclidine, Ur: NEGATIVE ng/mL
Propoxyphene: NEGATIVE ng/mL
TAPENTADOLUR: NEGATIVE ng/mL
TRAMADOL UR: NEGATIVE ng/mL
Zolpidem, Urine: NEGATIVE ng/mL
pH, Initial: 5.6 pH (ref 4.5–8.9)

## 2013-09-09 LAB — HEMOGLOBINOPATHY EVALUATION
HGB A2 QUANT: 2.9 % (ref 2.2–3.2)
HGB S QUANTITAION: 0 %
Hemoglobin Other: 0 %
Hgb A: 97.1 % (ref 96.8–97.8)
Hgb F Quant: 0 % (ref 0.0–2.0)

## 2013-09-09 LAB — CULTURE, OB URINE: Colony Count: >=100000

## 2013-09-12 ENCOUNTER — Other Ambulatory Visit: Payer: Self-pay | Admitting: Advanced Practice Midwife

## 2013-09-12 ENCOUNTER — Telehealth: Payer: Self-pay

## 2013-09-12 DIAGNOSIS — Z3490 Encounter for supervision of normal pregnancy, unspecified, unspecified trimester: Secondary | ICD-10-CM

## 2013-09-12 NOTE — Telephone Encounter (Signed)
First trimester screen scheduled for Monday 09/19/13 at 0930. Called patient. No answer. Left message informing patient of appointment date, time and location as well as directions to arrive at 0915 and to call clinic with questions.

## 2013-09-12 NOTE — Telephone Encounter (Signed)
Message copied by Louanna Raw on Mon Sep 12, 2013  9:01 AM ------      Message from: Sharen Counter A      Created: Fri Sep 09, 2013  9:38 PM      Regarding: Pt desires First screen with MFM       I saw this pt this week in clinic and thought I ordered an MFM consult for her first screen. I don't see my order or the scheduled visit.  Can we schedule an NT ultrasound and MFM consult for First screen for her?  She is 11 weeks now, so needs to be in the next 2 weeks.  Thanks. ------

## 2013-09-19 ENCOUNTER — Ambulatory Visit (HOSPITAL_COMMUNITY): Payer: BC Managed Care – PPO

## 2013-09-19 ENCOUNTER — Ambulatory Visit (HOSPITAL_COMMUNITY): Payer: BC Managed Care – PPO | Attending: Obstetrics & Gynecology

## 2013-10-05 ENCOUNTER — Ambulatory Visit (INDEPENDENT_AMBULATORY_CARE_PROVIDER_SITE_OTHER): Payer: BC Managed Care – PPO | Admitting: Obstetrics and Gynecology

## 2013-10-05 ENCOUNTER — Encounter: Payer: Self-pay | Admitting: Obstetrics and Gynecology

## 2013-10-05 VITALS — BP 111/64 | HR 93 | Temp 97.8°F | Wt 155.1 lb

## 2013-10-05 DIAGNOSIS — Z3492 Encounter for supervision of normal pregnancy, unspecified, second trimester: Secondary | ICD-10-CM

## 2013-10-05 DIAGNOSIS — Z6791 Unspecified blood type, Rh negative: Secondary | ICD-10-CM | POA: Insufficient documentation

## 2013-10-05 DIAGNOSIS — O219 Vomiting of pregnancy, unspecified: Secondary | ICD-10-CM

## 2013-10-05 DIAGNOSIS — Z3480 Encounter for supervision of other normal pregnancy, unspecified trimester: Secondary | ICD-10-CM

## 2013-10-05 DIAGNOSIS — O3090 Multiple gestation, unspecified, unspecified trimester: Secondary | ICD-10-CM

## 2013-10-05 DIAGNOSIS — O26899 Other specified pregnancy related conditions, unspecified trimester: Secondary | ICD-10-CM

## 2013-10-05 DIAGNOSIS — O360121 Maternal care for anti-D [Rh] antibodies, second trimester, fetus 1: Secondary | ICD-10-CM

## 2013-10-05 DIAGNOSIS — O210 Mild hyperemesis gravidarum: Secondary | ICD-10-CM

## 2013-10-05 DIAGNOSIS — O360990 Maternal care for other rhesus isoimmunization, unspecified trimester, not applicable or unspecified: Secondary | ICD-10-CM

## 2013-10-05 HISTORY — DX: Unspecified blood type, rh negative: Z67.91

## 2013-10-05 HISTORY — DX: Vomiting of pregnancy, unspecified: O21.9

## 2013-10-05 LAB — POCT URINALYSIS DIP (DEVICE)
GLUCOSE, UA: NEGATIVE mg/dL
Hgb urine dipstick: NEGATIVE
KETONES UR: NEGATIVE mg/dL
Nitrite: NEGATIVE
Protein, ur: 30 mg/dL — AB
Urobilinogen, UA: 1 mg/dL (ref 0.0–1.0)
pH: 6 (ref 5.0–8.0)

## 2013-10-05 NOTE — Patient Instructions (Signed)
Second Trimester of Pregnancy The second trimester is from week 13 through week 28, months 4 through 6. The second trimester is often a time when you feel your best. Your body has also adjusted to being pregnant, and you begin to feel better physically. Usually, morning sickness has lessened or quit completely, you may have more energy, and you may have an increase in appetite. The second trimester is also a time when the fetus is growing rapidly. At the end of the sixth month, the fetus is about 9 inches long and weighs about 1 pounds. You will likely begin to feel the baby move (quickening) between 18 and 20 weeks of the pregnancy. BODY CHANGES Your body goes through many changes during pregnancy. The changes vary from woman to woman.   Your weight will continue to increase. You will notice your lower abdomen bulging out.  You may begin to get stretch marks on your hips, abdomen, and breasts.  You may develop headaches that can be relieved by medicines approved by your health care provider.  You may urinate more often because the fetus is pressing on your bladder.  You may develop or continue to have heartburn as a result of your pregnancy.  You may develop constipation because certain hormones are causing the muscles that push waste through your intestines to slow down.  You may develop hemorrhoids or swollen, bulging veins (varicose veins).  You may have back pain because of the weight gain and pregnancy hormones relaxing your joints between the bones in your pelvis and as a result of a shift in weight and the muscles that support your balance.  Your breasts will continue to grow and be tender.  Your gums may bleed and may be sensitive to brushing and flossing.  Dark spots or blotches (chloasma, mask of pregnancy) may develop on your face. This will likely fade after the baby is born.  A dark line from your belly button to the pubic area (linea nigra) may appear. This will likely fade  after the baby is born.  You may have changes in your hair. These can include thickening of your hair, rapid growth, and changes in texture. Some women also have hair loss during or after pregnancy, or hair that feels dry or thin. Your hair will most likely return to normal after your baby is born. WHAT TO EXPECT AT YOUR PRENATAL VISITS During a routine prenatal visit:  You will be weighed to make sure you and the fetus are growing normally.  Your blood pressure will be taken.  Your abdomen will be measured to track your baby's growth.  The fetal heartbeat will be listened to.  Any test results from the previous visit will be discussed. Your health care provider may ask you:  How you are feeling.  If you are feeling the baby move.  If you have had any abnormal symptoms, such as leaking fluid, bleeding, severe headaches, or abdominal cramping.  If you have any questions. Other tests that may be performed during your second trimester include:  Blood tests that check for:  Low iron levels (anemia).  Gestational diabetes (between 24 and 28 weeks).  Rh antibodies.  Urine tests to check for infections, diabetes, or protein in the urine.  An ultrasound to confirm the proper growth and development of the baby.  An amniocentesis to check for possible genetic problems.  Fetal screens for spina bifida and Down syndrome. HOME CARE INSTRUCTIONS   Avoid all smoking, herbs, alcohol, and unprescribed   drugs. These chemicals affect the formation and growth of the baby.  Follow your health care provider's instructions regarding medicine use. There are medicines that are either safe or unsafe to take during pregnancy.  Exercise only as directed by your health care provider. Experiencing uterine cramps is a good sign to stop exercising.  Continue to eat regular, healthy meals.  Wear a good support bra for breast tenderness.  Do not use hot tubs, steam rooms, or saunas.  Wear your  seat belt at all times when driving.  Avoid raw meat, uncooked cheese, cat litter boxes, and soil used by cats. These carry germs that can cause birth defects in the baby.  Take your prenatal vitamins.  Try taking a stool softener (if your health care provider approves) if you develop constipation. Eat more high-fiber foods, such as fresh vegetables or fruit and whole grains. Drink plenty of fluids to keep your urine clear or pale yellow.  Take warm sitz baths to soothe any pain or discomfort caused by hemorrhoids. Use hemorrhoid cream if your health care provider approves.  If you develop varicose veins, wear support hose. Elevate your feet for 15 minutes, 3-4 times a day. Limit salt in your diet.  Avoid heavy lifting, wear low heel shoes, and practice good posture.  Rest with your legs elevated if you have leg cramps or low back pain.  Visit your dentist if you have not gone yet during your pregnancy. Use a soft toothbrush to brush your teeth and be gentle when you floss.  A sexual relationship may be continued unless your health care provider directs you otherwise.  Continue to go to all your prenatal visits as directed by your health care provider. SEEK MEDICAL CARE IF:   You have dizziness.  You have mild pelvic cramps, pelvic pressure, or nagging pain in the abdominal area.  You have persistent nausea, vomiting, or diarrhea.  You have a bad smelling vaginal discharge.  You have pain with urination. SEEK IMMEDIATE MEDICAL CARE IF:   You have a fever.  You are leaking fluid from your vagina.  You have spotting or bleeding from your vagina.  You have severe abdominal cramping or pain.  You have rapid weight gain or loss.  You have shortness of breath with chest pain.  You notice sudden or extreme swelling of your face, hands, ankles, feet, or legs.  You have not felt your baby move in over an hour.  You have severe headaches that do not go away with  medicine.  You have vision changes. Document Released: 03/18/2001 Document Revised: 03/29/2013 Document Reviewed: 05/25/2012 Exit[employer] Patient Information 2015 [employer]. This information is not intended to replace advice given to you by your health care provider. Make sure you discuss any questions you have with your health care provider.  

## 2013-10-05 NOTE — Progress Notes (Signed)
C/o crampy feeling sometimes, not oftern

## 2013-10-05 NOTE — Progress Notes (Signed)
Reviewed PN labs and explained RH neg and Rhophylactic at 28 wks. First screen was not ordered. Offered quad.> declines any genetic testing N/V of pregnancy resolved

## 2013-11-02 ENCOUNTER — Ambulatory Visit (INDEPENDENT_AMBULATORY_CARE_PROVIDER_SITE_OTHER): Payer: BC Managed Care – PPO | Admitting: Obstetrics and Gynecology

## 2013-11-02 ENCOUNTER — Ambulatory Visit (HOSPITAL_COMMUNITY)
Admission: RE | Admit: 2013-11-02 | Discharge: 2013-11-02 | Disposition: A | Discharge status: Home / Self Care | Payer: BC Managed Care – PPO | Source: Ambulatory Visit | Attending: Obstetrics and Gynecology | Admitting: Obstetrics and Gynecology

## 2013-11-02 ENCOUNTER — Encounter: Payer: Self-pay | Admitting: Obstetrics and Gynecology

## 2013-11-02 VITALS — BP 109/62 | Wt 156.7 lb

## 2013-11-02 DIAGNOSIS — Z3689 Encounter for other specified antenatal screening: Secondary | ICD-10-CM | POA: Insufficient documentation

## 2013-11-02 DIAGNOSIS — Z3480 Encounter for supervision of other normal pregnancy, unspecified trimester: Secondary | ICD-10-CM

## 2013-11-02 DIAGNOSIS — Z3492 Encounter for supervision of normal pregnancy, unspecified, second trimester: Secondary | ICD-10-CM

## 2013-11-02 LAB — POCT URINALYSIS DIP (DEVICE)
BILIRUBIN URINE: NEGATIVE
Glucose, UA: NEGATIVE mg/dL
Hgb urine dipstick: NEGATIVE
KETONES UR: NEGATIVE mg/dL
Nitrite: NEGATIVE
Protein, ur: NEGATIVE mg/dL
SPECIFIC GRAVITY, URINE: 1.015 (ref 1.005–1.030)
Urobilinogen, UA: 0.2 mg/dL (ref 0.0–1.0)
pH: 6 (ref 5.0–8.0)

## 2013-11-02 NOTE — Patient Instructions (Signed)
Second Trimester of Pregnancy The second trimester is from week 13 through week 28, months 4 through 6. The second trimester is often a time when you feel your best. Your body has also adjusted to being pregnant, and you begin to feel better physically. Usually, morning sickness has lessened or quit completely, you may have more energy, and you may have an increase in appetite. The second trimester is also a time when the fetus is growing rapidly. At the end of the sixth month, the fetus is about 9 inches long and weighs about 1 pounds. You will likely begin to feel the baby move (quickening) between 18 and 20 weeks of the pregnancy. BODY CHANGES Your body goes through many changes during pregnancy. The changes vary from woman to woman.   Your weight will continue to increase. You will notice your lower abdomen bulging out.  You may begin to get stretch marks on your hips, abdomen, and breasts.  You may develop headaches that can be relieved by medicines approved by your health care provider.  You may urinate more often because the fetus is pressing on your bladder.  You may develop or continue to have heartburn as a result of your pregnancy.  You may develop constipation because certain hormones are causing the muscles that push waste through your intestines to slow down.  You may develop hemorrhoids or swollen, bulging veins (varicose veins).  You may have back pain because of the weight gain and pregnancy hormones relaxing your joints between the bones in your pelvis and as a result of a shift in weight and the muscles that support your balance.  Your breasts will continue to grow and be tender.  Your gums may bleed and may be sensitive to brushing and flossing.  Dark spots or blotches (chloasma, mask of pregnancy) may develop on your face. This will likely fade after the baby is born.  A dark line from your belly button to the pubic area (linea nigra) may appear. This will likely fade  after the baby is born.  You may have changes in your hair. These can include thickening of your hair, rapid growth, and changes in texture. Some women also have hair loss during or after pregnancy, or hair that feels dry or thin. Your hair will most likely return to normal after your baby is born. WHAT TO EXPECT AT YOUR PRENATAL VISITS During a routine prenatal visit:  You will be weighed to make sure you and the fetus are growing normally.  Your blood pressure will be taken.  Your abdomen will be measured to track your baby's growth.  The fetal heartbeat will be listened to.  Any test results from the previous visit will be discussed. Your health care provider may ask you:  How you are feeling.  If you are feeling the baby move.  If you have had any abnormal symptoms, such as leaking fluid, bleeding, severe headaches, or abdominal cramping.  If you have any questions. Other tests that may be performed during your second trimester include:  Blood tests that check for:  Low iron levels (anemia).  Gestational diabetes (between 24 and 28 weeks).  Rh antibodies.  Urine tests to check for infections, diabetes, or protein in the urine.  An ultrasound to confirm the proper growth and development of the baby.  An amniocentesis to check for possible genetic problems.  Fetal screens for spina bifida and Down syndrome. HOME CARE INSTRUCTIONS   Avoid all smoking, herbs, alcohol, and unprescribed   drugs. These chemicals affect the formation and growth of the baby.  Follow your health care provider's instructions regarding medicine use. There are medicines that are either safe or unsafe to take during pregnancy.  Exercise only as directed by your health care provider. Experiencing uterine cramps is a good sign to stop exercising.  Continue to eat regular, healthy meals.  Wear a good support bra for breast tenderness.  Do not use hot tubs, steam rooms, or saunas.  Wear your  seat belt at all times when driving.  Avoid raw meat, uncooked cheese, cat litter boxes, and soil used by cats. These carry germs that can cause birth defects in the baby.  Take your prenatal vitamins.  Try taking a stool softener (if your health care provider approves) if you develop constipation. Eat more high-fiber foods, such as fresh vegetables or fruit and whole grains. Drink plenty of fluids to keep your urine clear or pale yellow.  Take warm sitz baths to soothe any pain or discomfort caused by hemorrhoids. Use hemorrhoid cream if your health care provider approves.  If you develop varicose veins, wear support hose. Elevate your feet for 15 minutes, 3-4 times a day. Limit salt in your diet.  Avoid heavy lifting, wear low heel shoes, and practice good posture.  Rest with your legs elevated if you have leg cramps or low back pain.  Visit your dentist if you have not gone yet during your pregnancy. Use a soft toothbrush to brush your teeth and be gentle when you floss.  A sexual relationship may be continued unless your health care provider directs you otherwise.  Continue to go to all your prenatal visits as directed by your health care provider. SEEK MEDICAL CARE IF:   You have dizziness.  You have mild pelvic cramps, pelvic pressure, or nagging pain in the abdominal area.  You have persistent nausea, vomiting, or diarrhea.  You have a bad smelling vaginal discharge.  You have pain with urination. SEEK IMMEDIATE MEDICAL CARE IF:   You have a fever.  You are leaking fluid from your vagina.  You have spotting or bleeding from your vagina.  You have severe abdominal cramping or pain.  You have rapid weight gain or loss.  You have shortness of breath with chest pain.  You notice sudden or extreme swelling of your face, hands, ankles, feet, or legs.  You have not felt your baby move in over an hour.  You have severe headaches that do not go away with  medicine.  You have vision changes. Document Released: 03/18/2001 Document Revised: 03/29/2013 Document Reviewed: 05/25/2012 Exit[employer] Patient Information 2015 [employer]. This information is not intended to replace advice given to you by your health care provider. Make sure you discuss any questions you have with your health care provider.  

## 2013-11-02 NOTE — Progress Notes (Signed)
Fatigued, but doing well. 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 Declines genetic screening. Had US this am.

## 2013-11-30 ENCOUNTER — Encounter: Payer: Self-pay | Admitting: Obstetrics and Gynecology

## 2013-11-30 ENCOUNTER — Ambulatory Visit (INDEPENDENT_AMBULATORY_CARE_PROVIDER_SITE_OTHER): Payer: BC Managed Care – PPO | Admitting: Obstetrics and Gynecology

## 2013-11-30 VITALS — BP 102/63 | HR 80 | Temp 98.2°F | Wt 160.9 lb

## 2013-11-30 DIAGNOSIS — Z3402 Encounter for supervision of normal first pregnancy, second trimester: Secondary | ICD-10-CM

## 2013-11-30 DIAGNOSIS — Z3400 Encounter for supervision of normal first pregnancy, unspecified trimester: Secondary | ICD-10-CM

## 2013-11-30 LAB — POCT URINALYSIS DIP (DEVICE)
Bilirubin Urine: NEGATIVE
Glucose, UA: NEGATIVE mg/dL
HGB URINE DIPSTICK: NEGATIVE
KETONES UR: NEGATIVE mg/dL
Nitrite: NEGATIVE
PROTEIN: NEGATIVE mg/dL
SPECIFIC GRAVITY, URINE: 1.015 (ref 1.005–1.030)
Urobilinogen, UA: 1 mg/dL (ref 0.0–1.0)
pH: 5.5 (ref 5.0–8.0)

## 2013-11-30 NOTE — Patient Instructions (Signed)
Second Trimester of Pregnancy The second trimester is from week 13 through week 28, months 4 through 6. The second trimester is often a time when you feel your best. Your body has also adjusted to being pregnant, and you begin to feel better physically. Usually, morning sickness has lessened or quit completely, you may have more energy, and you may have an increase in appetite. The second trimester is also a time when the fetus is growing rapidly. At the end of the sixth month, the fetus is about 9 inches long and weighs about 1 pounds. You will likely begin to feel the baby move (quickening) between 18 and 20 weeks of the pregnancy. BODY CHANGES Your body goes through many changes during pregnancy. The changes vary from woman to woman.   Your weight will continue to increase. You will notice your lower abdomen bulging out.  You may begin to get stretch marks on your hips, abdomen, and breasts.  You may develop headaches that can be relieved by medicines approved by your health care provider.  You may urinate more often because the fetus is pressing on your bladder.  You may develop or continue to have heartburn as a result of your pregnancy.  You may develop constipation because certain hormones are causing the muscles that push waste through your intestines to slow down.  You may develop hemorrhoids or swollen, bulging veins (varicose veins).  You may have back pain because of the weight gain and pregnancy hormones relaxing your joints between the bones in your pelvis and as a result of a shift in weight and the muscles that support your balance.  Your breasts will continue to grow and be tender.  Your gums may bleed and may be sensitive to brushing and flossing.  Dark spots or blotches (chloasma, mask of pregnancy) may develop on your face. This will likely fade after the baby is born.  A dark line from your belly button to the pubic area (linea nigra) may appear. This will likely fade  after the baby is born.  You may have changes in your hair. These can include thickening of your hair, rapid growth, and changes in texture. Some women also have hair loss during or after pregnancy, or hair that feels dry or thin. Your hair will most likely return to normal after your baby is born. WHAT TO EXPECT AT YOUR PRENATAL VISITS During a routine prenatal visit:  You will be weighed to make sure you and the fetus are growing normally.  Your blood pressure will be taken.  Your abdomen will be measured to track your baby's growth.  The fetal heartbeat will be listened to.  Any test results from the previous visit will be discussed. Your health care provider may ask you:  How you are feeling.  If you are feeling the baby move.  If you have had any abnormal symptoms, such as leaking fluid, bleeding, severe headaches, or abdominal cramping.  If you have any questions. Other tests that may be performed during your second trimester include:  Blood tests that check for:  Low iron levels (anemia).  Gestational diabetes (between 24 and 28 weeks).  Rh antibodies.  Urine tests to check for infections, diabetes, or protein in the urine.  An ultrasound to confirm the proper growth and development of the baby.  An amniocentesis to check for possible genetic problems.  Fetal screens for spina bifida and Down syndrome. HOME CARE INSTRUCTIONS   Avoid all smoking, herbs, alcohol, and unprescribed   drugs. These chemicals affect the formation and growth of the baby.  Follow your health care provider's instructions regarding medicine use. There are medicines that are either safe or unsafe to take during pregnancy.  Exercise only as directed by your health care provider. Experiencing uterine cramps is a good sign to stop exercising.  Continue to eat regular, healthy meals.  Wear a good support bra for breast tenderness.  Do not use hot tubs, steam rooms, or saunas.  Wear your  seat belt at all times when driving.  Avoid raw meat, uncooked cheese, cat litter boxes, and soil used by cats. These carry germs that can cause birth defects in the baby.  Take your prenatal vitamins.  Try taking a stool softener (if your health care provider approves) if you develop constipation. Eat more high-fiber foods, such as fresh vegetables or fruit and whole grains. Drink plenty of fluids to keep your urine clear or pale yellow.  Take warm sitz baths to soothe any pain or discomfort caused by hemorrhoids. Use hemorrhoid cream if your health care provider approves.  If you develop varicose veins, wear support hose. Elevate your feet for 15 minutes, 3-4 times a day. Limit salt in your diet.  Avoid heavy lifting, wear low heel shoes, and practice good posture.  Rest with your legs elevated if you have leg cramps or low back pain.  Visit your dentist if you have not gone yet during your pregnancy. Use a soft toothbrush to brush your teeth and be gentle when you floss.  A sexual relationship may be continued unless your health care provider directs you otherwise.  Continue to go to all your prenatal visits as directed by your health care provider. SEEK MEDICAL CARE IF:   You have dizziness.  You have mild pelvic cramps, pelvic pressure, or nagging pain in the abdominal area.  You have persistent nausea, vomiting, or diarrhea.  You have a bad smelling vaginal discharge.  You have pain with urination. SEEK IMMEDIATE MEDICAL CARE IF:   You have a fever.  You are leaking fluid from your vagina.  You have spotting or bleeding from your vagina.  You have severe abdominal cramping or pain.  You have rapid weight gain or loss.  You have shortness of breath with chest pain.  You notice sudden or extreme swelling of your face, hands, ankles, feet, or legs.  You have not felt your baby move in over an hour.  You have severe headaches that do not go away with  medicine.  You have vision changes. Document Released: 03/18/2001 Document Revised: 03/29/2013 Document Reviewed: 05/25/2012 Exit[employer] Patient Information 2015 [employer]. This information is not intended to replace advice given to you by your health care provider. Make sure you discuss any questions you have with your health care provider.  

## 2013-11-30 NOTE — Progress Notes (Signed)
F/U anatomy US scheduled. Has vaginal irritation, not itch and white discharge. Used new soap but no external irritation. NEFG. Physiologic d/c. WP sent.  Still busy with restaurant and school

## 2013-11-30 NOTE — Progress Notes (Signed)
Pt is concerned about discharge/irritation in vaginal area

## 2013-11-30 NOTE — Progress Notes (Signed)
Ultrasound scheduled for 12/07/13

## 2013-12-01 LAB — WET PREP, GENITAL
Clue Cells Wet Prep HPF POC: NONE SEEN
Trich, Wet Prep: NONE SEEN
WBC WET PREP: NONE SEEN

## 2013-12-06 ENCOUNTER — Telehealth: Payer: Self-pay | Admitting: General Practice

## 2013-12-06 DIAGNOSIS — B379 Candidiasis, unspecified: Secondary | ICD-10-CM

## 2013-12-06 MED ORDER — FLUCONAZOLE 150 MG PO TABS: 150.0000 mg | ORAL_TABLET | Freq: Once | ORAL | Status: DC

## 2013-12-06 NOTE — Telephone Encounter (Signed)
Per chart review patient had vaginal irritation and had wet prep done. Yeast infection present. Called patient, no answer and unable to leave message due to no voicemail box set up.

## 2013-12-07 ENCOUNTER — Encounter: Payer: Self-pay | Admitting: General Practice

## 2013-12-07 ENCOUNTER — Ambulatory Visit (HOSPITAL_COMMUNITY)
Admission: RE | Admit: 2013-12-07 | Discharge: 2013-12-07 | Disposition: A | Discharge status: Home / Self Care | Payer: Medicaid Other | Source: Ambulatory Visit | Attending: Obstetrics and Gynecology | Admitting: Obstetrics and Gynecology

## 2013-12-07 DIAGNOSIS — O358XX0 Maternal care for other (suspected) fetal abnormality and damage, not applicable or unspecified: Secondary | ICD-10-CM | POA: Diagnosis not present

## 2013-12-07 DIAGNOSIS — Z3402 Encounter for supervision of normal first pregnancy, second trimester: Secondary | ICD-10-CM

## 2013-12-07 DIAGNOSIS — Z3689 Encounter for other specified antenatal screening: Secondary | ICD-10-CM | POA: Diagnosis not present

## 2013-12-07 DIAGNOSIS — Z3492 Encounter for supervision of normal pregnancy, unspecified, second trimester: Secondary | ICD-10-CM

## 2013-12-07 MED ORDER — FLUCONAZOLE 150 MG PO TABS: 150.0000 mg | ORAL_TABLET | Freq: Once | ORAL | Status: DC

## 2013-12-07 NOTE — Telephone Encounter (Signed)
Called patient and phone went to voicemail, unable to leave message due to no voicemail set up. Will send letter

## 2013-12-14 ENCOUNTER — Encounter (HOSPITAL_COMMUNITY): Payer: Self-pay | Admitting: *Deleted

## 2013-12-14 ENCOUNTER — Inpatient Hospital Stay (HOSPITAL_COMMUNITY)
Admission: AD | Admit: 2013-12-14 | Discharge: 2013-12-14 | Disposition: A | Discharge status: Home / Self Care | Payer: BC Managed Care – PPO | Source: Ambulatory Visit | Attending: Obstetrics & Gynecology | Admitting: Obstetrics & Gynecology

## 2013-12-14 DIAGNOSIS — B373 Candidiasis of vulva and vagina: Secondary | ICD-10-CM | POA: Diagnosis not present

## 2013-12-14 DIAGNOSIS — B3731 Acute candidiasis of vulva and vagina: Secondary | ICD-10-CM | POA: Diagnosis not present

## 2013-12-14 DIAGNOSIS — O2390 Unspecified genitourinary tract infection in pregnancy, unspecified trimester: Secondary | ICD-10-CM | POA: Diagnosis not present

## 2013-12-14 DIAGNOSIS — R109 Unspecified abdominal pain: Secondary | ICD-10-CM | POA: Diagnosis present

## 2013-12-14 LAB — URINALYSIS, ROUTINE W REFLEX MICROSCOPIC
Bilirubin Urine: NEGATIVE
Glucose, UA: NEGATIVE mg/dL
Hgb urine dipstick: NEGATIVE
KETONES UR: 15 mg/dL — AB
Nitrite: NEGATIVE
PH: 6 (ref 5.0–8.0)
PROTEIN: NEGATIVE mg/dL
Specific Gravity, Urine: 1.025 (ref 1.005–1.030)
Urobilinogen, UA: 0.2 mg/dL (ref 0.0–1.0)

## 2013-12-14 LAB — WET PREP, GENITAL
Clue Cells Wet Prep HPF POC: NONE SEEN
TRICH WET PREP: NONE SEEN

## 2013-12-14 LAB — URINE MICROSCOPIC-ADD ON

## 2013-12-14 LAB — RAPID HIV SCREEN (WH-MAU): Rapid HIV Screen: NONREACTIVE

## 2013-12-14 MED ORDER — FLUCONAZOLE 150 MG PO TABS
150.0000 mg | ORAL_TABLET | Freq: Once | ORAL | Status: AC
  Administered 2013-12-14: 150 mg via ORAL
  Filled 2013-12-14: qty 1

## 2013-12-14 NOTE — MAU Note (Signed)
Lower pelvic pain x 2 weeks, pt states it feels like muscle pain.  Pain is worse with movement, walking, changing positions.  Denies bleeding or LOF.  Also has vaginal irritation.

## 2013-12-14 NOTE — Discharge Instructions (Signed)

## 2013-12-14 NOTE — MAU Provider Note (Signed)
History     CSN: [id number]  Arrival date and time: 12/14/13 1830   First Provider Initiated Contact with Patient 12/14/13 2042      Chief Complaint  Patient presents with  . Pelvic Pain   HPI  This is a 23 yo G3P0020 at  GA 25.2 by LMP c/w [redacted]w[redacted]d sono who presents to L&D for complaints of lower abdominal pain. Onset of symptoms 2-3 days ago. No pain currently. Pain occurs with movement. No contractions, leakage of fluid, or vaginal bleeding. Has had a vaginal discharge that has been thick and white and appearance with associated vaginal irritation. Reports good fetal movement. Reports no episodes of abdominal trauma or falls. No sexual contact recently. No fevers or chills.  Past Medical History  Diagnosis Date  . Medical history non-contributory     Past Surgical History  Procedure Laterality Date  . Induced abortion    . Mouth surgery      History reviewed. No pertinent family history.  History  Substance Use Topics  . Smoking status: Never Smoker   . Smokeless tobacco: Not on file  . Alcohol Use: No    Allergies: No Known Allergies  Prescriptions prior to admission  Medication Sig Dispense Refill  . OVER THE COUNTER MEDICATION Take 1 tablet by mouth 4 (four) times daily. Baby and Me Prenatal Vitamins        Review of Systems  Constitutional: Negative for fever and chills.  Gastrointestinal: Positive for abdominal pain. Negative for nausea and vomiting.  Genitourinary: Negative for dysuria and urgency.       Vaginal discharge  Neurological: Negative for headaches.   Physical Exam   Blood pressure 103/69, pulse 91, temperature 97.4 F (36.3 C), temperature source Oral, resp. rate 18, height  (1.626 m), weight 161 lb 9.6 oz (73.301 kg), last menstrual period 06/20/2013.  Physical Exam  Constitutional: She is oriented to person, place, and time. She appears well-developed. No distress.  HENT:  Head: Normocephalic.  Eyes: Conjunctivae and EOM are normal.   Neck: Normal range of motion.  Cardiovascular: Normal rate, regular rhythm and normal heart sounds.   Respiratory: Effort normal and breath sounds normal. No respiratory distress.  GI: Soft. Bowel sounds are normal. There is no tenderness.  Genitourinary: Vaginal discharge found.  Copious amounts of milky white discharge noted in vaginal vault and surrounding cervix. No lesions. Cervix is visually closed.  Musculoskeletal: She exhibits no edema.  Neurological: She is alert and oriented to person, place, and time.  Skin: Skin is warm and dry.   TOCO: no contractions FHTs; Baseline fetal heart rate of 145 with accelerations of at least 10 bpm above baseline for at least 10 seconds duration  MAU Course  Procedures    Assessment and Plan  This is a 23 yo G3P0020 at  GA 25.2 by LMP c/w [redacted]w[redacted]d sono who presents to L&D for complaints of lower abdominal pain.  1. IUP at 25.2. Not in active labor given visually closed cervix on exam and absence of contractions on tocometer. -PTL precautions given.  2. FWB. NST reactive and reassuring given gestational age, overall category I strip.  -Fetal activity precautions reviewed.  3. Abdominal pain. Resolved during the time of examination. -No contractions on monitor, no concerns for PTL at this time as mentioned above, precautions reviewed. -Likely musculoskeletal in nature secondary to round ligament pain vs cervicitis given evidence of candidiasis on exam confirmed by wet prep. -GC chlamydia and HIV obtained and sent.  4. Vaginal candidiasis. Noted on exam and confirmed by wet prep. -s/p diflucan  PO X 1 in MAU today.  Caliegh Middlekauff ROCIO 12/14/2013, 9:51 PM

## 2013-12-15 LAB — GC/CHLAMYDIA PROBE AMP
CT Probe RNA: NEGATIVE
GC PROBE AMP APTIMA: NEGATIVE

## 2013-12-19 NOTE — MAU Provider Note (Signed)
Attestation of Attending Supervision of Advanced Practitioner: Evaluation and management procedures were performed by the PA/NP/CNM/OB Fellow under my supervision/collaboration. Chart reviewed and agree with management and plan.  Tilda Burrow 12/19/2013 6:31 PM

## 2013-12-28 ENCOUNTER — Inpatient Hospital Stay (HOSPITAL_COMMUNITY)
Admission: AD | Admit: 2013-12-28 | Discharge: 2013-12-28 | Disposition: A | Discharge status: Home / Self Care | Payer: BC Managed Care – PPO | Source: Ambulatory Visit | Attending: Family Medicine | Admitting: Family Medicine

## 2013-12-28 ENCOUNTER — Encounter: Payer: Self-pay | Admitting: Family

## 2013-12-28 ENCOUNTER — Encounter (HOSPITAL_COMMUNITY): Payer: Self-pay

## 2013-12-28 DIAGNOSIS — O9989 Other specified diseases and conditions complicating pregnancy, childbirth and the puerperium: Principal | ICD-10-CM

## 2013-12-28 DIAGNOSIS — Z3492 Encounter for supervision of normal pregnancy, unspecified, second trimester: Secondary | ICD-10-CM

## 2013-12-28 DIAGNOSIS — O99891 Other specified diseases and conditions complicating pregnancy: Secondary | ICD-10-CM | POA: Diagnosis not present

## 2013-12-28 DIAGNOSIS — R109 Unspecified abdominal pain: Secondary | ICD-10-CM | POA: Diagnosis present

## 2013-12-28 DIAGNOSIS — N949 Unspecified condition associated with female genital organs and menstrual cycle: Secondary | ICD-10-CM

## 2013-12-28 DIAGNOSIS — M79609 Pain in unspecified limb: Secondary | ICD-10-CM | POA: Insufficient documentation

## 2013-12-28 LAB — URINALYSIS, ROUTINE W REFLEX MICROSCOPIC
Bilirubin Urine: NEGATIVE
Glucose, UA: NEGATIVE mg/dL
HGB URINE DIPSTICK: NEGATIVE
Ketones, ur: NEGATIVE mg/dL
NITRITE: NEGATIVE
PH: 6 (ref 5.0–8.0)
Protein, ur: NEGATIVE mg/dL
SPECIFIC GRAVITY, URINE: 1.02 (ref 1.005–1.030)
Urobilinogen, UA: 0.2 mg/dL (ref 0.0–1.0)

## 2013-12-28 LAB — URINE MICROSCOPIC-ADD ON

## 2013-12-28 NOTE — MAU Note (Signed)
Patient states she has had lower abdominal pain for a while but is getting worse. Is worse with movement. Denies bleeding or leaking. Reports good fetal movement.

## 2013-12-28 NOTE — Discharge Instructions (Signed)

## 2013-12-28 NOTE — MAU Provider Note (Signed)
Attestation of Attending Supervision of Obstetric Fellow: Evaluation and management procedures were performed by the Obstetric Fellow under my supervision and collaboration.  I have reviewed the Obstetric Fellow's note and chart, and I agree with the management and plan.  Jacob Stinson, DO Attending Physician Faculty Practice, [facility]   

## 2013-12-28 NOTE — MAU Provider Note (Signed)
  History     CSN: [id number]  Arrival date and time: 12/28/13 1343   None     Chief Complaint  Patient presents with  . Abdominal Pain   Abdominal Pain Pertinent negatives include no diarrhea, dysuria, fever, frequency, hematuria, nausea or vomiting.    Abdominal pain: bilateral pelvic pain shooting to vagina, sometimes at night and with movement of legs.  Worse in morning.  Has taken tylenol with no improvement.  Improves with hot bath.  No pain currently  Right leg pain: sometimes shooting pain at night when she rolls over.  Past Medical History  Diagnosis Date  . Medical history non-contributory     Past Surgical History  Procedure Laterality Date  . Induced abortion    . Mouth surgery      History reviewed. No pertinent family history.  History  Substance Use Topics  . Smoking status: Never Smoker   . Smokeless tobacco: Not on file  . Alcohol Use: No    Allergies: No Known Allergies  Prescriptions prior to admission  Medication Sig Dispense Refill  . OVER THE COUNTER MEDICATION Take 1 tablet by mouth 4 (four) times daily. Baby and Me Prenatal Vitamins        Review of Systems  Constitutional: Negative for fever and chills.  Respiratory: Negative for cough and shortness of breath.   Cardiovascular: Negative for chest pain and leg swelling.  Gastrointestinal: Positive for abdominal pain. Negative for heartburn, nausea, vomiting and diarrhea.  Genitourinary: Negative for dysuria, urgency, frequency and hematuria.  Neurological:       No headache   Physical Exam   Blood pressure 107/66, pulse 90, temperature 98.6 F (37 C), temperature source Oral, resp. rate 16, height  (1.626 m), weight 165 lb (74.844 kg), last menstrual period 06/20/2013, SpO2 100.00%.  Physical Exam  Constitutional: She is oriented to person, place, and time. She appears well-developed and well-nourished.  HENT:  Head: Normocephalic and atraumatic.  Eyes: Conjunctivae and EOM  are normal.  Neck: Normal range of motion.  Cardiovascular: Normal rate, regular rhythm and normal heart sounds.   Respiratory: Effort normal. No respiratory distress.  GI: Soft. Bowel sounds are normal. She exhibits no distension. There is no tenderness.  Musculoskeletal: Normal range of motion. She exhibits no edema.  Neurological: She is alert and oriented to person, place, and time.  Skin: Skin is warm and dry. No erythema.    MAU Course  Procedures  MDM NST - appropriate for gestational age, reactive  Assessment and Plan  Round ligament pain - warm compresses, hot showers, tylenol prn, abdominal support band prn  Brooke Mullen ROCIO 12/28/2013, 2:41 PM

## 2014-01-02 ENCOUNTER — Encounter (HOSPITAL_COMMUNITY): Payer: Self-pay | Admitting: *Deleted

## 2014-01-02 ENCOUNTER — Inpatient Hospital Stay (HOSPITAL_COMMUNITY)
Admission: AD | Admit: 2014-01-02 | Discharge: 2014-01-03 | Disposition: A | Discharge status: Home / Self Care | Payer: BC Managed Care – PPO | Source: Ambulatory Visit | Attending: Family Medicine | Admitting: Family Medicine

## 2014-01-02 DIAGNOSIS — N898 Other specified noninflammatory disorders of vagina: Secondary | ICD-10-CM

## 2014-01-02 DIAGNOSIS — N939 Abnormal uterine and vaginal bleeding, unspecified: Secondary | ICD-10-CM

## 2014-01-02 DIAGNOSIS — O4690 Antepartum hemorrhage, unspecified, unspecified trimester: Secondary | ICD-10-CM | POA: Insufficient documentation

## 2014-01-02 LAB — CBC
HCT: 32.2 % — ABNORMAL LOW (ref 36.0–46.0)
Hemoglobin: 11.3 g/dL — ABNORMAL LOW (ref 12.0–15.0)
MCH: 31.8 pg (ref 26.0–34.0)
MCHC: 35.1 g/dL (ref 30.0–36.0)
MCV: 90.7 fL (ref 78.0–100.0)
PLATELETS: 194 10*3/uL (ref 150–400)
RBC: 3.55 MIL/uL — ABNORMAL LOW (ref 3.87–5.11)
RDW: 13.3 % (ref 11.5–15.5)
WBC: 9.3 10*3/uL (ref 4.0–10.5)

## 2014-01-02 LAB — URINALYSIS, ROUTINE W REFLEX MICROSCOPIC
Bilirubin Urine: NEGATIVE
GLUCOSE, UA: NEGATIVE mg/dL
Hgb urine dipstick: NEGATIVE
Ketones, ur: NEGATIVE mg/dL
Leukocytes, UA: NEGATIVE
Nitrite: NEGATIVE
PH: 7 (ref 5.0–8.0)
Protein, ur: NEGATIVE mg/dL
Specific Gravity, Urine: 1.01 (ref 1.005–1.030)
Urobilinogen, UA: 0.2 mg/dL (ref 0.0–1.0)

## 2014-01-02 LAB — WET PREP, GENITAL
Clue Cells Wet Prep HPF POC: NONE SEEN
Trich, Wet Prep: NONE SEEN

## 2014-01-02 MED ORDER — RHO D IMMUNE GLOBULIN 1500 UNIT/2ML IJ SOSY
300.0000 ug | PREFILLED_SYRINGE | Freq: Once | INTRAMUSCULAR | Status: AC
  Administered 2014-01-03: 300 ug via INTRAMUSCULAR
  Filled 2014-01-02: qty 2

## 2014-01-02 MED ORDER — FLUCONAZOLE 150 MG PO TABS: 150.0000 mg | ORAL_TABLET | Freq: Every day | ORAL | Status: DC

## 2014-01-02 NOTE — MAU Provider Note (Signed)
Chief Complaint:  Vaginal Bleeding   First Provider Initiated Contact with Patient 01/02/14 2059      HPI: Brooke Mullen is a 23 y.o. G3P0020 at [redacted]w[redacted]d who presents to maternity admissions reporting vaginal bleeding.  She reports last night, she went to bathroom and noted bright red blood on toilet paper and a small quarter sized amount of blood in toilet bowl after urination. She did not continue to notice spotting, but called her primary OB who recommended reporting to MAU if any further bleeding.  Later on, around 12pm this afternoon, again noted some bright red blood with urination and on toilet paper and thus presented to MAU for eval.  Denies any spotting. Has been able to go to bathroom without noting any spotting or pink or bright red discharge.  Denies tobacco use, drug use, h/o HTN, abdominal trauma, recent intercourse.  Denies contractions, leakage of fluid. Good fetal movement.   Pregnancy Course:  N&V RH neg  Past Medical History: Past Medical History  Diagnosis Date  . Medical history non-contributory     Past obstetric history: OB History  Gravida Para Term Preterm AB SAB TAB Ectopic Multiple Living     # Outcome Date GA Lbr Len/2nd Weight Sex Delivery Anes PTL Lv  3 CUR           2 TAB 03/2013          1 TAB 2014             Comments: System Generated. Please review and update pregnancy details.      Past Surgical History: Past Surgical History  Procedure Laterality Date  . Induced abortion    . Mouth surgery       Family History: History reviewed. No pertinent family history.  Social History: History  Substance Use Topics  . Smoking status: Never Smoker   . Smokeless tobacco: Not on file  . Alcohol Use: No    Allergies: No Known Allergies  Meds:  No prescriptions prior to admission    ROS: Pertinent findings in history of present illness.  Physical Exam  Blood pressure 100/64, pulse 88, temperature 98.2 F (36.8  C), temperature source Oral, resp. rate 18, last menstrual period 06/20/2013. GENERAL: Well-developed, well-nourished female in no acute distress.  HEENT: normocephalic HEART: normal rate RESP: normal effort ABDOMEN: Soft, non-tender, gravid appropriate for gestational age EXTREMITIES: Nontender, no edema NEURO: alert and oriented SPECULUM EXAM: NEFG, physiologic discharge, no blood, cervix clean Dilation: Closed Effacement (%): Thick Exam by:: Ethelda Chick, MD  FHT:  Baseline 150 , moderate variability, accelerations present, no late decelerations; rare variable decelerations, AGA Contractions: original irritability, resolved before d/c. No contractions.   Labs: Results for orders placed during the hospital encounter of 01/02/14 (from the past 24 hour(s))  URINALYSIS, ROUTINE W REFLEX MICROSCOPIC     Status: None   Collection Time    01/02/14  6:30 PM      Result Value Ref Range   Color, Urine YELLOW  YELLOW   APPearance CLEAR  CLEAR   Specific Gravity, Urine 1.010  1.005 - 1.030   pH 7.0  5.0 - 8.0   Glucose, UA NEGATIVE  NEGATIVE mg/dL   Hgb urine dipstick NEGATIVE  NEGATIVE   Bilirubin Urine NEGATIVE  NEGATIVE   Ketones, ur NEGATIVE  NEGATIVE mg/dL   Protein, ur NEGATIVE  NEGATIVE mg/dL   Urobilinogen, UA 0.2  0.0 - 1.0 mg/dL  Nitrite NEGATIVE  NEGATIVE   Leukocytes, UA NEGATIVE  NEGATIVE  WET PREP, GENITAL     Status: Abnormal   Collection Time    01/02/14  9:10 PM      Result Value Ref Range   Yeast Wet Prep HPF POC FEW (*) NONE SEEN   Trich, Wet Prep NONE SEEN  NONE SEEN   Clue Cells Wet Prep HPF POC NONE SEEN  NONE SEEN   WBC, Wet Prep HPF POC FEW (*) NONE SEEN  CBC     Status: Abnormal   Collection Time    01/02/14  9:12 PM      Result Value Ref Range   WBC 9.3  4.0 - 10.5 K/uL   RBC 3.55 (*) 3.87 - 5.11 MIL/uL   Hemoglobin 11.3 (*) 12.0 - 15.0 g/dL   HCT 16.1 (*) 09.6 - 04.5 %   MCV 90.7  78.0 - 100.0 fL   MCH 31.8  26.0 - 34.0 pg   MCHC 35.1  30.0  - 36.0 g/dL   RDW 40.9  81.1 - 91.4 %   Platelets 194  150 - 400 K/uL  RH IG WORKUP (INCLUDES ABO/RH)     Status: None   Collection Time    01/02/14  9:12 PM      Result Value Ref Range   Gestational Age(Wks) 28     ABO/RH(D) O NEG     Antibody Screen NEG     Fetal Screen NEG     Unit Number [id number]     Blood Component Type RHIG     Unit division 00     Status of Unit ISSUED     Transfusion Status OK TO TRANSFUSE      Imaging:  US Ob Follow Up  12/07/2013   OBSTETRICAL ULTRASOUND: This exam was performed within a [facility]. The OB US report was generated in the [facility] system, and faxed to the ordering physician.   This report is available in the [employer]. See the [facility] Obstetric US report via the Image Link.  MAU Course: Pt seen in MAU for reports of vaginal bleeding. On SSE, no evidence of vaginal bleeding with no active bleeding, no evidence of old blood in vaginal vault or on cervix. Vaginal discharge white and appears c/w yeast infection. No clear source of bleeding with no cervical friablity. + Yeast on wet prep, but low suspicion this is causing vaginal bleeding.   Very low suspicion for abruption given reactive NST and no reports of abdominal pain or cramping with cervix c/t/h.  Given no evidence of vaginal bleeding, will d/c home with strict abruption precuations and monitoring for dec FM or abdominal pain  Fluconazole 150 mg PO x 1 for yeast  Assessment: 1. Vaginal bleeding     Plan: Discharge home Abruption precautions Pre-term Labor precautions and fetal kick counts     Follow-up Information   Follow up with No PCP Per Patient. Schedule an appointment as soon as possible for a visit in 2 days. (for vaginal bleeding)    Specialty:  General Practice       Medication List         fluconazole 150 MG tablet  Commonly known as:  DIFLUCAN  Take 1 tablet (150 mg total) by mouth daily.     prenatal multivitamin Tabs tablet  Take 1  tablet by mouth daily at 12 noon.        Ethelda Chick, MD 01/03/2014 4:13 AM

## 2014-01-02 NOTE — MAU Note (Signed)
Pt presents to MAU with complaints of vaginal bleeding. States she notices it when she wipes and noticed it in the toilet earlier today.

## 2014-01-02 NOTE — MAU Note (Signed)
Urine in lab 

## 2014-01-03 DIAGNOSIS — O4690 Antepartum hemorrhage, unspecified, unspecified trimester: Secondary | ICD-10-CM | POA: Diagnosis not present

## 2014-01-03 LAB — GC/CHLAMYDIA PROBE AMP
CT Probe RNA: NEGATIVE
GC Probe RNA: NEGATIVE

## 2014-01-03 NOTE — Progress Notes (Signed)
No s/s of adverse reaction or sensitivity to injection. Pt denies any pain.

## 2014-01-04 LAB — RH IG WORKUP (INCLUDES ABO/RH)
ABO/RH(D): O NEG
ANTIBODY SCREEN: NEGATIVE
FETAL SCREEN: NEGATIVE
GESTATIONAL AGE(WKS): 28
Unit division: 0

## 2014-01-04 NOTE — MAU Provider Note (Signed)
Attestation of Attending Supervision of Obstetric Fellow: Evaluation and management procedures were performed by the Obstetric Fellow under my supervision and collaboration.  I have reviewed the Obstetric Fellow's note and chart, and I agree with the management and plan.  Jacob Stinson, DO Attending Physician Faculty Practice, [facility]  

## 2014-01-11 ENCOUNTER — Ambulatory Visit (INDEPENDENT_AMBULATORY_CARE_PROVIDER_SITE_OTHER): Payer: BC Managed Care – PPO | Admitting: Obstetrics and Gynecology

## 2014-01-11 ENCOUNTER — Encounter: Payer: Self-pay | Admitting: Obstetrics & Gynecology

## 2014-01-11 ENCOUNTER — Encounter: Payer: Self-pay | Admitting: Obstetrics and Gynecology

## 2014-01-11 VITALS — BP 102/64 | HR 85 | Wt 167.3 lb

## 2014-01-11 DIAGNOSIS — Z23 Encounter for immunization: Secondary | ICD-10-CM

## 2014-01-11 DIAGNOSIS — Z3493 Encounter for supervision of normal pregnancy, unspecified, third trimester: Secondary | ICD-10-CM

## 2014-01-11 LAB — POCT URINALYSIS DIP (DEVICE)
BILIRUBIN URINE: NEGATIVE
GLUCOSE, UA: NEGATIVE mg/dL
HGB URINE DIPSTICK: NEGATIVE
Ketones, ur: NEGATIVE mg/dL
LEUKOCYTES UA: NEGATIVE
NITRITE: NEGATIVE
Protein, ur: NEGATIVE mg/dL
Specific Gravity, Urine: 1.015 (ref 1.005–1.030)
Urobilinogen, UA: 1 mg/dL (ref 0.0–1.0)
pH: 6 (ref 5.0–8.0)

## 2014-01-11 LAB — CBC
HEMATOCRIT: 31.3 % — AB (ref 36.0–46.0)
Hemoglobin: 11.2 g/dL — ABNORMAL LOW (ref 12.0–15.0)
MCH: 31.2 pg (ref 26.0–34.0)
MCHC: 35.8 g/dL (ref 30.0–36.0)
MCV: 87.2 fL (ref 78.0–100.0)
Platelets: 192 10*3/uL (ref 150–400)
RBC: 3.59 MIL/uL — AB (ref 3.87–5.11)
RDW: 13.7 % (ref 11.5–15.5)
WBC: 7.9 10*3/uL (ref 4.0–10.5)

## 2014-01-11 LAB — RPR

## 2014-01-11 MED ORDER — TETANUS-DIPHTH-ACELL PERTUSSIS 5-2.5-18.5 LF-MCG/0.5 IM SUSP: 0.5000 mL | Freq: Once | INTRAMUSCULAR | Status: DC

## 2014-01-11 NOTE — Progress Notes (Signed)
Patient went to mau for bleeding and ligament pain. No more bleeding, it was just spotting never heavy. Received rhogham last week.

## 2014-01-11 NOTE — Progress Notes (Signed)
No further VB. Report of blood on toilet tissue 9/28 but none on exam in MAU and no RF for abruption. Good FM. No irritative vaginal d/c or postcoital spotting. Return if recurs.  Glucola and tdap today.

## 2014-01-11 NOTE — Patient Instructions (Signed)
°  Vaginal Bleeding During Pregnancy, Third Trimester °A small amount of bleeding (spotting) from the vagina is relatively common in pregnancy. Various things can cause bleeding or spotting in pregnancy. Sometimes the bleeding is normal and is not a problem. However, bleeding during the third trimester can also be a sign of something serious for the mother and the baby. Be sure to tell your health care provider about any vaginal bleeding right away.  °Some possible causes of vaginal bleeding during the third trimester include:  °· The placenta may be partially or completely covering the opening to the cervix (placenta previa).   °· The placenta may have separated from the uterus (abruption of the placenta).   °· There may be an infection or growth on the cervix.   °· You may be starting labor, called discharging of the mucus plug.   °· The placenta may grow into the muscle layer of the uterus (placenta accreta).   °HOME CARE INSTRUCTIONS  °Watch your condition for any changes. The following actions may help to lessen any discomfort you are feeling:  °· Follow your health care provider's instructions for limiting your activity. If your health care provider orders bed rest, you may need to stay in bed and only get up to use the bathroom. However, your health care provider may allow you to continue light activity. °· If needed, make plans for someone to help with your regular activities and responsibilities while you are on bed rest. °· Keep track of the number of pads you use each day, how often you change pads, and how soaked (saturated) they are. Write this down. °· Do not use tampons. Do not douche. °· Do not have sexual intercourse or orgasms until approved by your health care provider. °· Follow your health care provider's advice about lifting, driving, and physical activities. °· If you pass any tissue from your vagina, save the tissue so you can show it to your health care provider.   °· Only take  over-the-counter or prescription medicines as directed by your health care provider. °· Do not take aspirin because it can make you bleed.   °· Keep all follow-up appointments as directed by your health care provider. °SEEK MEDICAL CARE IF: °· You have any vaginal bleeding during any part of your pregnancy. °· You have cramps or labor pains. °· You have a fever, not controlled by medicine. °SEEK IMMEDIATE MEDICAL CARE IF:  °· You have severe cramps or pain in your back or belly (abdomen). °· You have chills. °· You have a gush of fluid from the vagina. °· You pass large clots or tissue from your vagina. °· Your bleeding increases. °· You feel light-headed or weak. °· You pass out. °· You feel less movement or no movement of the baby.   °MAKE SURE YOU: °· Understand these instructions. °· Will watch your condition. °· Will get help right away if you are not doing well or get worse. °Document Released: 06/14/2002 Document Revised: 03/29/2013 Document Reviewed: 11/29/2012 °ExitCare® Patient Information ©2015 [employer]. This information is not intended to replace advice given to you by your health care provider. Make sure you discuss any questions you have with your health care provider. ° ° °

## 2014-01-12 LAB — HIV ANTIBODY (ROUTINE TESTING W REFLEX): HIV 1&2 Ab, 4th Generation: NONREACTIVE

## 2014-01-12 LAB — GLUCOSE TOLERANCE, 1 HOUR (50G) W/O FASTING: Glucose, 1 Hour GTT: 109 mg/dL (ref 70–140)

## 2014-01-26 ENCOUNTER — Ambulatory Visit (INDEPENDENT_AMBULATORY_CARE_PROVIDER_SITE_OTHER): Payer: BC Managed Care – PPO | Admitting: Family Medicine

## 2014-01-26 VITALS — BP 107/67 | HR 85 | Temp 98.2°F | Wt 167.2 lb

## 2014-01-26 DIAGNOSIS — Z3493 Encounter for supervision of normal pregnancy, unspecified, third trimester: Secondary | ICD-10-CM

## 2014-01-26 DIAGNOSIS — Z23 Encounter for immunization: Secondary | ICD-10-CM

## 2014-01-26 LAB — POCT URINALYSIS DIP (DEVICE)
Bilirubin Urine: NEGATIVE
Glucose, UA: NEGATIVE mg/dL
Hgb urine dipstick: NEGATIVE
KETONES UR: NEGATIVE mg/dL
Nitrite: NEGATIVE
PH: 6.5 (ref 5.0–8.0)
Protein, ur: NEGATIVE mg/dL
Specific Gravity, Urine: 1.025 (ref 1.005–1.030)
Urobilinogen, UA: 0.2 mg/dL (ref 0.0–1.0)

## 2014-01-26 NOTE — Patient Instructions (Signed)
Third Trimester of Pregnancy The third trimester is from week 29 through week 42, months 7 through 9. The third trimester is a time when the fetus is growing rapidly. At the end of the ninth month, the fetus is about 20 inches in length and weighs 6-10 pounds.  BODY CHANGES Your body goes through many changes during pregnancy. The changes vary from woman to woman.   Your weight will continue to increase. You can expect to gain 25-35 pounds (11-16 kg) by the end of the pregnancy.  You may begin to get stretch marks on your hips, abdomen, and breasts.  You may urinate more often because the fetus is moving lower into your pelvis and pressing on your bladder.  You may develop or continue to have heartburn as a result of your pregnancy.  You may develop constipation because certain hormones are causing the muscles that push waste through your intestines to slow down.  You may develop hemorrhoids or swollen, bulging veins (varicose veins).  You may have pelvic pain because of the weight gain and pregnancy hormones relaxing your joints between the bones in your pelvis. Backaches may result from overexertion of the muscles supporting your posture.  You may have changes in your hair. These can include thickening of your hair, rapid growth, and changes in texture. Some women also have hair loss during or after pregnancy, or hair that feels dry or thin. Your hair will most likely return to normal after your baby is born.  Your breasts will continue to grow and be tender. A yellow discharge may leak from your breasts called colostrum.  Your belly button may stick out.  You may feel short of breath because of your expanding uterus.  You may notice the fetus "dropping," or moving lower in your abdomen.  You may have a bloody mucus discharge. This usually occurs a few days to a week before labor begins.  Your cervix becomes thin and soft (effaced) near your due date. WHAT TO EXPECT AT YOUR PRENATAL  EXAMS  You will have prenatal exams every 2 weeks until week 36. Then, you will have weekly prenatal exams. During a routine prenatal visit:  You will be weighed to make sure you and the fetus are growing normally.  Your blood pressure is taken.  Your abdomen will be measured to track your baby's growth.  The fetal heartbeat will be listened to.  Any test results from the previous visit will be discussed.  You may have a cervical check near your due date to see if you have effaced. At around 36 weeks, your caregiver will check your cervix. At the same time, your caregiver will also perform a test on the secretions of the vaginal tissue. This test is to determine if a type of bacteria, Group B streptococcus, is present. Your caregiver will explain this further. Your caregiver may ask you:  What your birth plan is.  How you are feeling.  If you are feeling the baby move.  If you have had any abnormal symptoms, such as leaking fluid, bleeding, severe headaches, or abdominal cramping.  If you have any questions. Other tests or screenings that may be performed during your third trimester include:  Blood tests that check for low iron levels (anemia).  Fetal testing to check the health, activity level, and growth of the fetus. Testing is done if you have certain medical conditions or if there are problems during the pregnancy. FALSE LABOR You may feel small, irregular contractions that   eventually go away. These are called Braxton Hicks contractions, or false labor. Contractions may last for hours, days, or even weeks before true labor sets in. If contractions come at regular intervals, intensify, or become painful, it is best to be seen by your caregiver.  SIGNS OF LABOR   Menstrual-like cramps.  Contractions that are 5 minutes apart or less.  Contractions that start on the top of the uterus and spread down to the lower abdomen and back.  A sense of increased pelvic pressure or back  pain.  A watery or bloody mucus discharge that comes from the vagina. If you have any of these signs before the 37th week of pregnancy, call your caregiver right away. You need to go to the hospital to get checked immediately. HOME CARE INSTRUCTIONS   Avoid all smoking, herbs, alcohol, and unprescribed drugs. These chemicals affect the formation and growth of the baby.  Follow your caregiver's instructions regarding medicine use. There are medicines that are either safe or unsafe to take during pregnancy.  Exercise only as directed by your caregiver. Experiencing uterine cramps is a good sign to stop exercising.  Continue to eat regular, healthy meals.  Wear a good support bra for breast tenderness.  Do not use hot tubs, steam rooms, or saunas.  Wear your seat belt at all times when driving.  Avoid raw meat, uncooked cheese, cat litter boxes, and soil used by cats. These carry germs that can cause birth defects in the baby.  Take your prenatal vitamins.  Try taking a stool softener (if your caregiver approves) if you develop constipation. Eat more high-fiber foods, such as fresh vegetables or fruit and whole grains. Drink plenty of fluids to keep your urine clear or pale yellow.  Take warm sitz baths to soothe any pain or discomfort caused by hemorrhoids. Use hemorrhoid cream if your caregiver approves.  If you develop varicose veins, wear support hose. Elevate your feet for 15 minutes, 3-4 times a day. Limit salt in your diet.  Avoid heavy lifting, wear low heal shoes, and practice good posture.  Rest a lot with your legs elevated if you have leg cramps or low back pain.  Visit your dentist if you have not gone during your pregnancy. Use a soft toothbrush to brush your teeth and be gentle when you floss.  A sexual relationship may be continued unless your caregiver directs you otherwise.  Do not travel far distances unless it is absolutely necessary and only with the approval  of your caregiver.  Take prenatal classes to understand, practice, and ask questions about the labor and delivery.  Make a trial run to the hospital.  Pack your hospital bag.  Prepare the baby's nursery.  Continue to go to all your prenatal visits as directed by your caregiver. SEEK MEDICAL CARE IF:  You are unsure if you are in labor or if your water has broken.  You have dizziness.  You have mild pelvic cramps, pelvic pressure, or nagging pain in your abdominal area.  You have persistent nausea, vomiting, or diarrhea.  You have a bad smelling vaginal discharge.  You have pain with urination. SEEK IMMEDIATE MEDICAL CARE IF:   You have a fever.  You are leaking fluid from your vagina.  You have spotting or bleeding from your vagina.  You have severe abdominal cramping or pain.  You have rapid weight loss or gain.  You have shortness of breath with chest pain.  You notice sudden or extreme swelling   of your face, hands, ankles, feet, or legs.  You have not felt your baby move in over an hour.  You have severe headaches that do not go away with medicine.  You have vision changes. Document Released: 03/18/2001 Document Revised: 03/29/2013 Document Reviewed: 05/25/2012 Exit[employer] Patient Information 2015 [employer]. This information is not intended to replace advice given to you by your health care provider. Make sure you discuss any questions you have with your health care provider.  

## 2014-01-26 NOTE — Progress Notes (Signed)
Still c/o pain in vaginal / perineal area , but no change since before.

## 2014-01-26 NOTE — Progress Notes (Signed)
Patient without complaints.  Denies vaginal bleeding, abnormal vaginal discharge, contractions, loss of fluid.  Denies abdominal pain, headache, scotoma.  Reports good fetal activity.  Labor precautions reviewed.  Follow up in 2 weeks.  

## 2014-02-03 ENCOUNTER — Encounter: Payer: Self-pay | Admitting: General Practice

## 2014-02-06 ENCOUNTER — Encounter: Payer: Self-pay | Admitting: Obstetrics and Gynecology

## 2014-02-10 ENCOUNTER — Encounter: Payer: BC Managed Care – PPO | Admitting: Physician Assistant

## 2014-02-20 ENCOUNTER — Encounter (HOSPITAL_COMMUNITY): Payer: Self-pay | Admitting: *Deleted

## 2014-02-20 ENCOUNTER — Inpatient Hospital Stay (HOSPITAL_COMMUNITY)
Admission: AD | Admit: 2014-02-20 | Discharge: 2014-02-20 | Disposition: A | Discharge status: Home / Self Care | Payer: BC Managed Care – PPO | Source: Ambulatory Visit | Attending: Obstetrics and Gynecology | Admitting: Obstetrics and Gynecology

## 2014-02-20 DIAGNOSIS — Z3A35 35 weeks gestation of pregnancy: Secondary | ICD-10-CM | POA: Diagnosis not present

## 2014-02-20 DIAGNOSIS — O26893 Other specified pregnancy related conditions, third trimester: Secondary | ICD-10-CM | POA: Insufficient documentation

## 2014-02-20 DIAGNOSIS — R32 Unspecified urinary incontinence: Secondary | ICD-10-CM | POA: Diagnosis not present

## 2014-02-20 DIAGNOSIS — N898 Other specified noninflammatory disorders of vagina: Secondary | ICD-10-CM | POA: Diagnosis not present

## 2014-02-20 LAB — WET PREP, GENITAL
Clue Cells Wet Prep HPF POC: NONE SEEN
Trich, Wet Prep: NONE SEEN
YEAST WET PREP: NONE SEEN

## 2014-02-20 LAB — POCT FERN TEST: POCT FERN TEST: NEGATIVE

## 2014-02-20 NOTE — MAU Provider Note (Signed)
  History     CSN: [id number]  Arrival date and time: 02/20/14 1112   None     Chief Complaint  Patient presents with  . Vaginal Discharge   HPI  Patient is 23 y.o. [id number] here with complaints of leaking of fluid around 1030 this morning, was clear.  Reports it was a significant amount, felt it running down leg/through dress.  Does not think she is currently leaking.  +FM, denies VB, contractions, vaginal discharge.     Past Medical History  Diagnosis Date  . Medical history non-contributory     Past Surgical History  Procedure Laterality Date  . Induced abortion    . Mouth surgery      History reviewed. No pertinent family history.  History  Substance Use Topics  . Smoking status: Never Smoker   . Smokeless tobacco: Never Used  . Alcohol Use: Yes     Comment: none with preg    Allergies: No Known Allergies  Facility-administered medications prior to admission  Medication Dose Route Frequency Provider Last Rate Last Dose  . Tdap (BOOSTRIX) injection 0.5 mL  0.5 mL Intramuscular Once Danae Orleanseirdre C Poe, CNM       Prescriptions prior to admission  Medication Sig Dispense Refill Last Dose  . Prenatal Vit-Fe Fumarate-FA (PRENATAL MULTIVITAMIN) TABS tablet Take 1 tablet by mouth daily at 12 noon.   02/19/2014 at Unknown time    Review of Systems  Constitutional: Positive for chills. Negative for fever.  Respiratory: Negative for cough and shortness of breath.   Cardiovascular: Negative for chest pain and leg swelling.  Gastrointestinal: Negative for heartburn, nausea, vomiting and diarrhea.  Genitourinary: Negative for dysuria, urgency, frequency and hematuria.  Neurological:       No headache   Physical Exam   Blood pressure 103/75, pulse 85, temperature 98.3 F (36.8 C), temperature source Oral, resp. rate 16, last menstrual period 06/20/2013.  Physical Exam  Constitutional: She is oriented to person, place, and time. She appears well-developed and  well-nourished.  HENT:  Head: Normocephalic and atraumatic.  Eyes: Conjunctivae and EOM are normal.  Neck: Normal range of motion.  Cardiovascular: Normal rate.   Respiratory: Effort normal. No respiratory distress.  GI: Soft. She exhibits no distension. There is no tenderness.  Genitourinary:  Sterile spec exam: no pooling of fluid, negative valsalva, significant amount of white thing discharge.  Musculoskeletal: Normal range of motion. She exhibits no edema.  Neurological: She is alert and oriented to person, place, and time.  Skin: Skin is warm and dry. No erythema.   Dilation: Fingertip Effacement (%): Thick  MAU Course  Procedures  MDM NST: reactive, uterine irritability Wet prep  Assessment and Plan  Patient is 23 y.o. [id number] reporting loss of fluid likely secondary to vaginal discharge and urinary incontinence - fetal kick counts reinforced - preterm labor precautions - kegel exercises - patient discharged after wet prep, suspect normal vaginal discharge of pregnancy.  Will call patient if wet prep abnormal and rx indicated.  Patient very agreeable with plan and ready to go.  Jamyra Zweig ROCIO 02/20/2014, 12:17 PM

## 2014-02-20 NOTE — MAU Note (Signed)
No sign of ROM, no pooling.  Neg fern

## 2014-02-20 NOTE — Discharge Instructions (Signed)
Normal Labor and Delivery ° °Your caregiver must first be sure you are in labor. Signs of labor include: ° °· You may pass what is called "the mucus plug" before labor begins. This is a small amount of blood stained mucus. °· Regular uterine contractions. °· The time between contractions get closer together. °· The discomfort and pain gradually gets more intense. °· Pains are mostly located in the back. °· Pains get worse when walking. °· The cervix (the opening of the uterus becomes thinner (begins to efface) and opens up (dilates). ° ° °Once you are in labor and admitted into the hospital or care center, your caregiver will do the following: ° °· A complete physical examination. °· Check your vital signs (blood pressure, pulse, temperature and the fetal heart rate). °· Do a vaginal examination (using a sterile glove and lubricant) to determine: °· The position (presentation) of the baby (head [vertex] or buttock first). °· The level (station) of the baby's head in the birth canal. °· The effacement and dilatation of the cervix. °· An electronic monitor is usually placed on your abdomen. The monitor follows the length and intensity of the contractions, as well as the baby's heart rate. °· your caregiver may insert an IV in your arm with a bottle of sugar water. This is done as a precaution so that medications can be given to you quickly during labor or delivery. ° ° °NORMAL LABOR AND DELIVERY IS DIVIDED UP INTO 3 STAGES: ° °First Stage °This is when regular contractions begin and the cervix begins to efface and dilate. This stage can last from 3 to 15 hours. The end of the first stage is when the cervix is 100% effaced and 10 centimeters dilated. Pain medications may be given by  °· Injection (morphine, demerol, etc.) °· Regional anesthesia (spinal, caudal or epidural, anesthetics given in different locations of the spine). Paracervical pain medication may be given, which is an injection of and anesthetic on each  side of the cervix. °A pregnant woman may request to have "Natural Childbirth" which is not to have any medications or anesthesia during her labor and delivery. ° °Second Stage °This is when the baby comes down through the birth canal (vagina) and is born. This can take 1 to 4 hours. As the baby's head comes down through the birth canal, you may feel like you are going to have a bowel movement. You will get the urge to bear down and push until the baby is delivered. As the baby's head is being delivered, the caregiver will decide if an episiotomy (a cut in the perineum and vagina area) is needed to prevent tearing of the tissue in this area. The episiotomy is sewn up after the delivery of the baby and placenta. Sometimes a mask with nitrous oxide is given for the mother to breath during the delivery of the baby to help if there is too much pain. The end of Stage 2 is when the baby is fully delivered. Then when the umbilical cord stops pulsating it is clamped and cut. ° °Third Stage °The third stage begins after the baby is completely delivered and ends after the placenta (afterbirth) is delivered. This usually takes 5 to 30 minutes. After the placenta is delivered, a medication is given either by intravenous or injection to help contract the uterus and prevent bleeding. The third stage is not painful and pain medication is usually not necessary. If there was a tear, it is repaired at this time. °  After the delivery, the mother is watched and monitored closely for 1 to 2 hours to make sure there is no postpartum bleeding (hemorrhage). If there is a lot of bleeding, medication is given to contract the uterus and stop the bleeding. ° ° °Document Released: 01/01/2008 Document Revised: 06/16/2011 Document Reviewed: 01/01/2008 °ExitCare® Patient Information ©2013 [employer]. ° ° °

## 2014-02-20 NOTE — MAU Note (Signed)
Was sitting in class,.  Noted wetness. Underwear and dress were wet.

## 2014-02-21 IMAGING — US US OB COMP LESS 14 WK
1 series · 14 of 28 positions shown · non-contrast
Comparison: None.

CLINICAL DATA: Pelvic pain, cramping

EXAM:
OBSTETRIC <14 WK US AND TRANSVAGINAL OB US
TECHNIQUE: Both transabdominal and transvaginal ultrasound examinations were
performed for complete evaluation of the gestation as well as the
maternal uterus, adnexal regions, and pelvic cul-de-sac.
Transvaginal technique was performed to assess early pregnancy.

[Series 1: us ob comp less 14 wks · 14 of 49 slices shown]
[im 2/49]
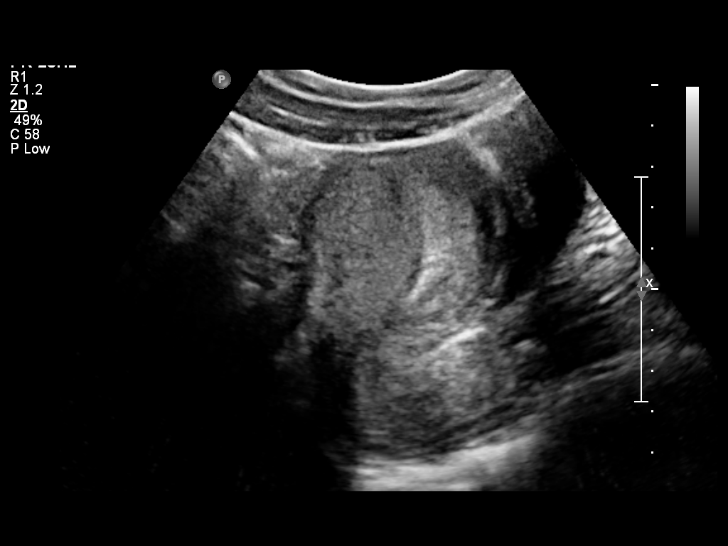
[im 6/49]
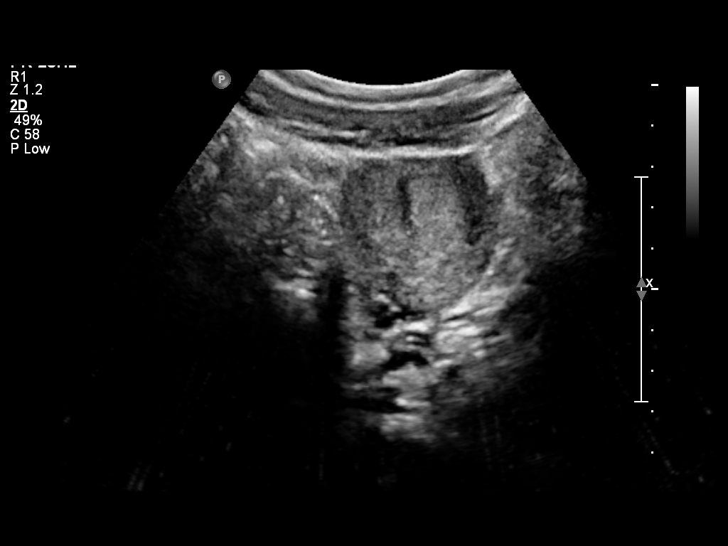
[im 9/49]
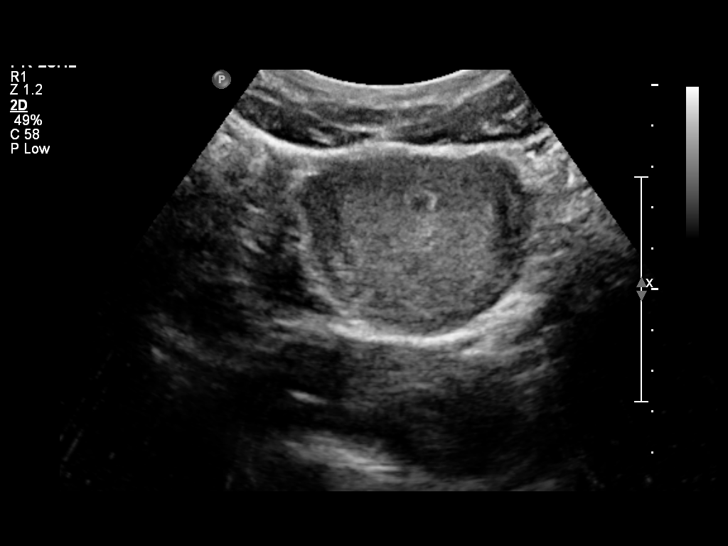
[im 13/49]
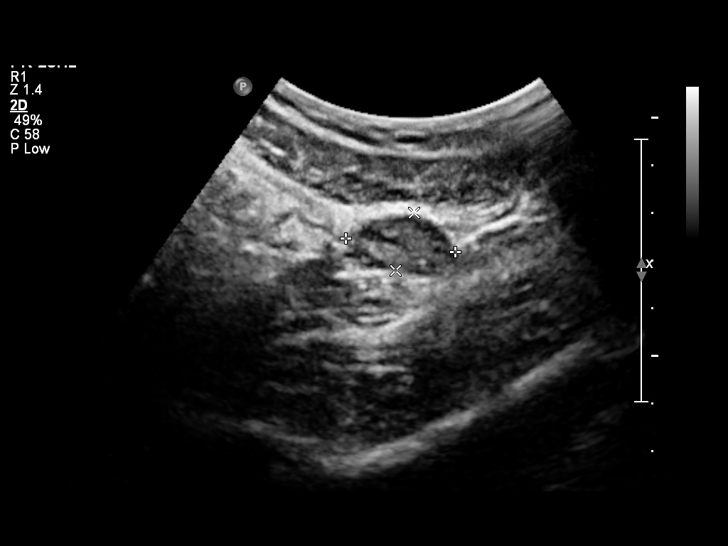
[im 17/49]
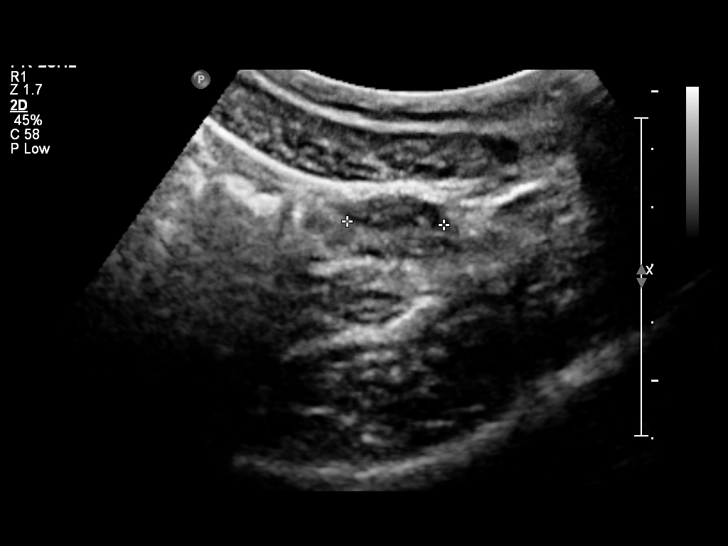
[im 20/49]
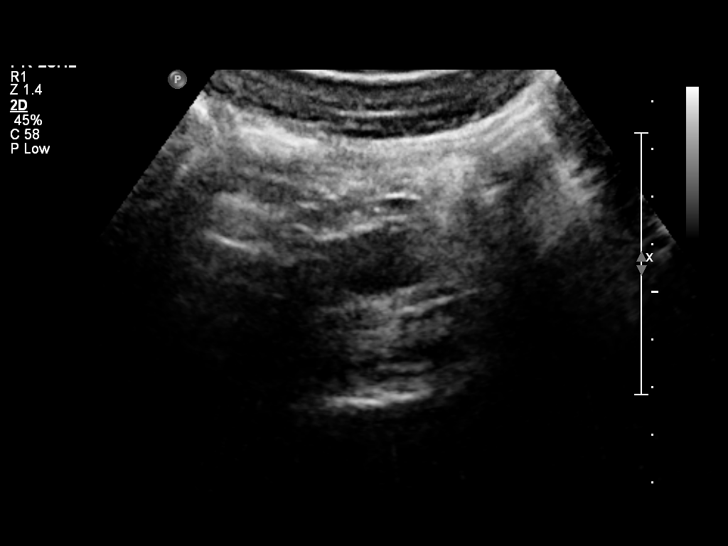
[im 24/49]
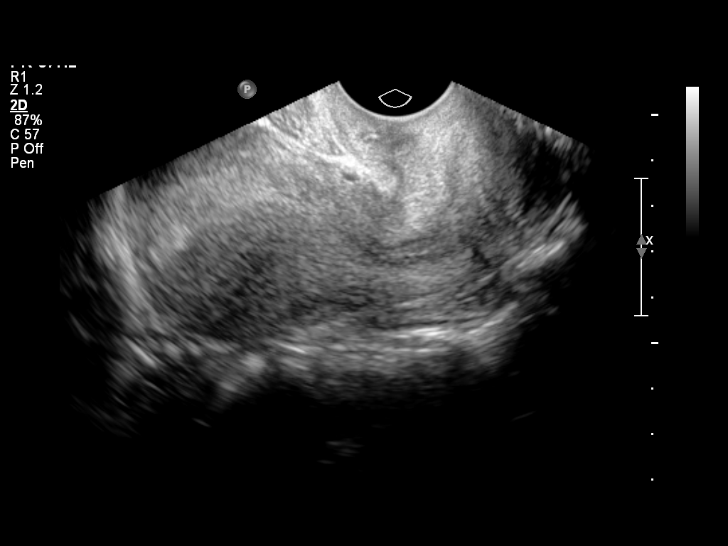
[im 27/49]
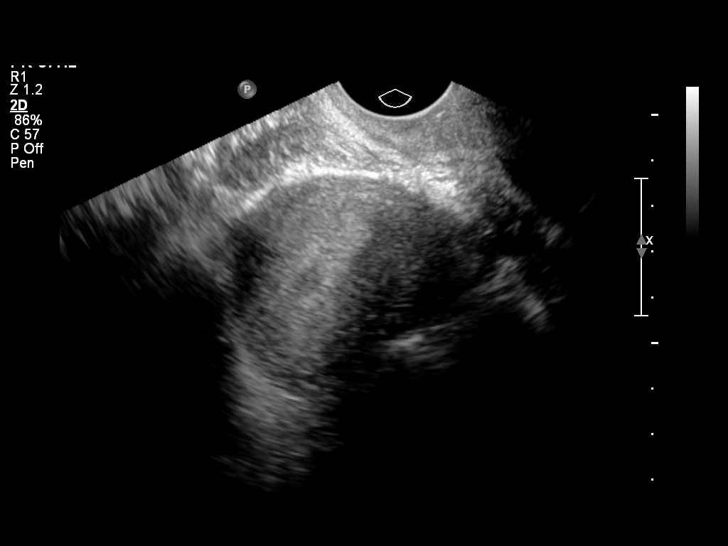
[im 31/49]
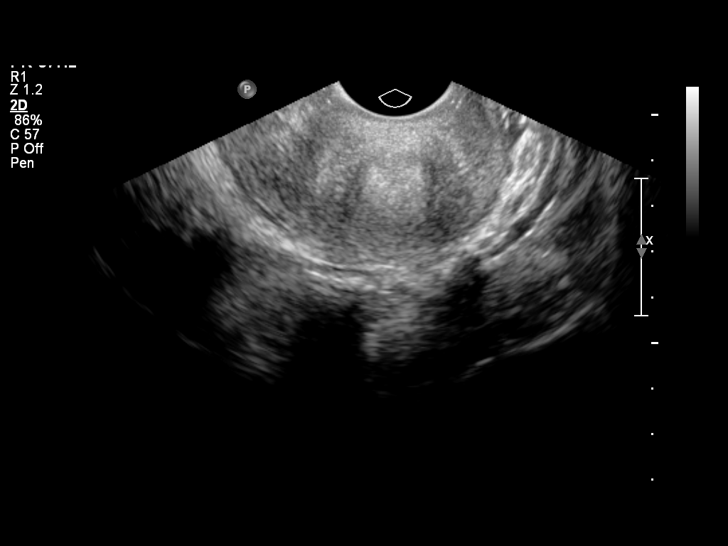
[im 34/49]
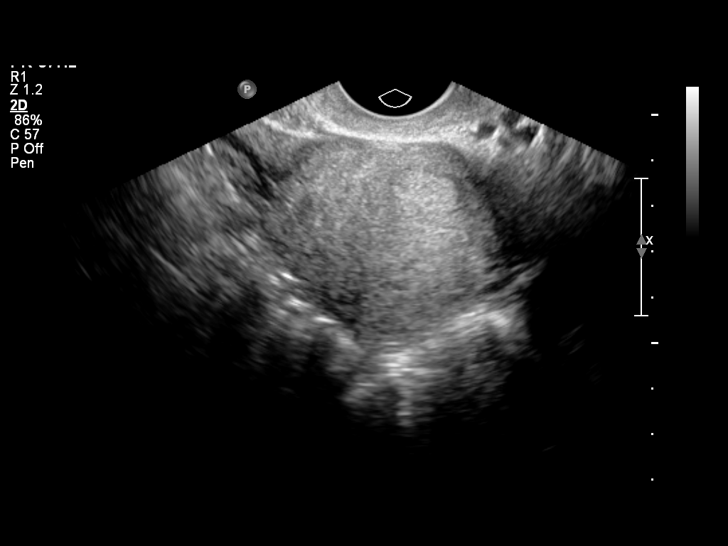
[im 38/49]
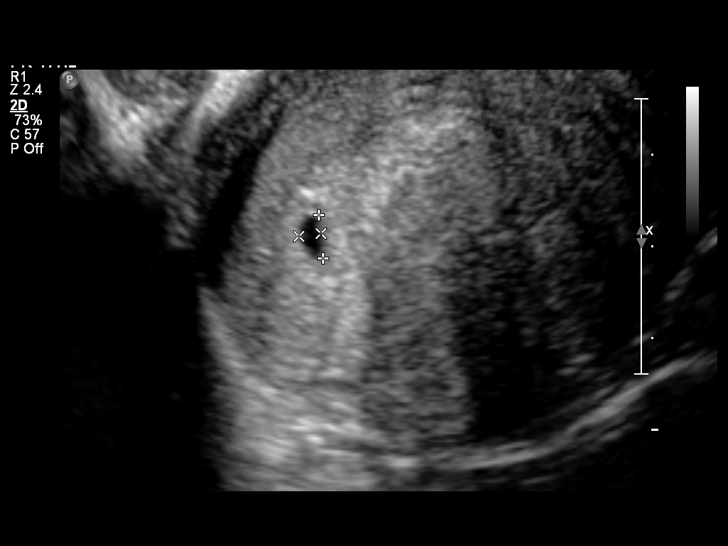
[im 41/49]
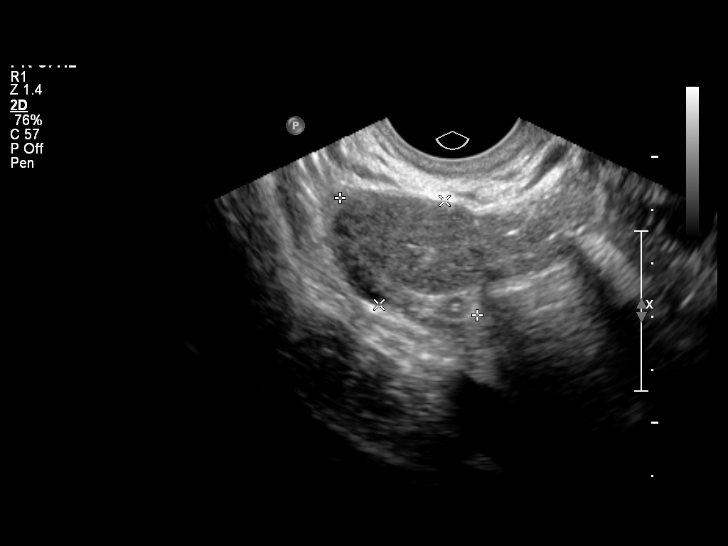
[im 45/49]
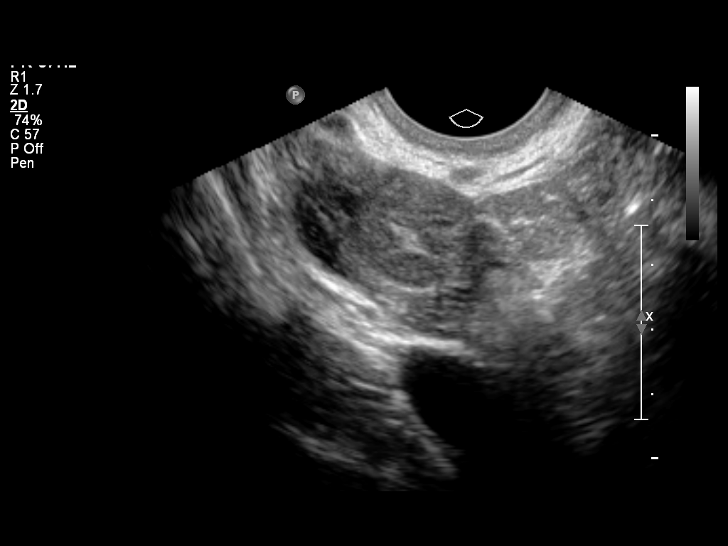
[im 49/49]
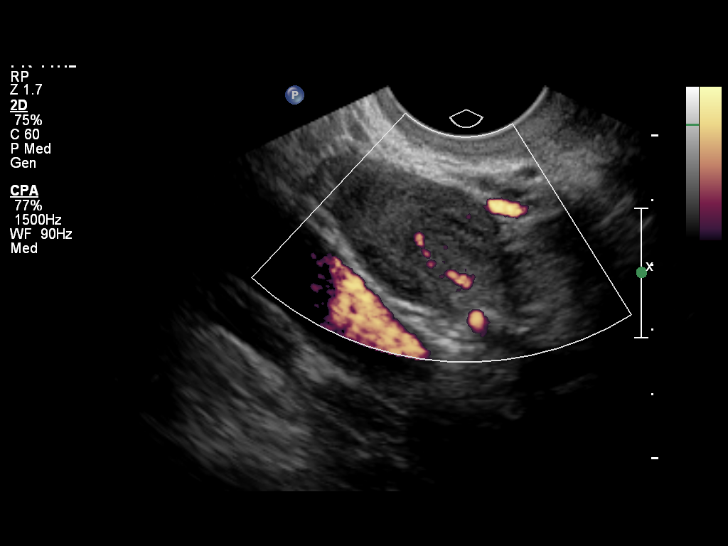

[14 of 28 positions shown; findings below may reference images not displayed]

FINDINGS: Intrauterine gestational sac: Visualized/normal in shape.

Yolk sac:  Not identified

Embryo:  Not identified

Cardiac Activity: Not applicable

MSD: 3.6 mm   4 w   6  d

US EDC: 11/09/2013

Maternal uterus/adnexae: No subchorionic hemorrhage. Grossly normal
sonographic appearance to the ovaries. Corpus luteal cyst on the
right. No free fluid.
IMPRESSION: Single intrauterine gestation. No embryo at this time, likely due to
the early age of the pregnancy. Estimated age of 4 weeks 6 days by
mean sac diameter. Recommend ultrasound follow-up.

## 2014-02-22 ENCOUNTER — Telehealth (HOSPITAL_COMMUNITY): Payer: Self-pay | Admitting: *Deleted

## 2014-02-22 NOTE — Telephone Encounter (Signed)
Pt called in, saying she was here on Monday  For possible ROM- had an exam and test and was told she could go home without the result... Wanting to know the result if her water was broken . ("foreign Asian lady" with [facility]).  Neg fern results.  Pt has appointment in office tomorrow- instructed to keep appt

## 2014-02-23 ENCOUNTER — Ambulatory Visit (INDEPENDENT_AMBULATORY_CARE_PROVIDER_SITE_OTHER): Payer: BC Managed Care – PPO | Admitting: Family Medicine

## 2014-02-23 VITALS — BP 101/58 | HR 83 | Temp 98.2°F | Wt 171.4 lb

## 2014-02-23 DIAGNOSIS — Z3493 Encounter for supervision of normal pregnancy, unspecified, third trimester: Secondary | ICD-10-CM

## 2014-02-23 LAB — POCT URINALYSIS DIP (DEVICE)
Glucose, UA: NEGATIVE mg/dL
HGB URINE DIPSTICK: NEGATIVE
KETONES UR: NEGATIVE mg/dL
Nitrite: NEGATIVE
PROTEIN: 30 mg/dL — AB
SPECIFIC GRAVITY, URINE: 1.025 (ref 1.005–1.030)
Urobilinogen, UA: 1 mg/dL (ref 0.0–1.0)
pH: 6 (ref 5.0–8.0)

## 2014-02-23 NOTE — Progress Notes (Signed)
Patient without complaints.  Denies vaginal bleeding, abnormal vaginal discharge, contractions, loss of fluid.  Denies abdominal pain, headache, scotoma.  Reports good fetal activity.  Labor precautions reviewed.  Follow up in 1 weeks.  

## 2014-02-23 NOTE — Patient Instructions (Signed)
Third Trimester of Pregnancy The third trimester is from week 29 through week 42, months 7 through 9. The third trimester is a time when the fetus is growing rapidly. At the end of the ninth month, the fetus is about 20 inches in length and weighs 6-10 pounds.  BODY CHANGES Your body goes through many changes during pregnancy. The changes vary from woman to woman.   Your weight will continue to increase. You can expect to gain 25-35 pounds (11-16 kg) by the end of the pregnancy.  You may begin to get stretch marks on your hips, abdomen, and breasts.  You may urinate more often because the fetus is moving lower into your pelvis and pressing on your bladder.  You may develop or continue to have heartburn as a result of your pregnancy.  You may develop constipation because certain hormones are causing the muscles that push waste through your intestines to slow down.  You may develop hemorrhoids or swollen, bulging veins (varicose veins).  You may have pelvic pain because of the weight gain and pregnancy hormones relaxing your joints between the bones in your pelvis. Backaches may result from overexertion of the muscles supporting your posture.  You may have changes in your hair. These can include thickening of your hair, rapid growth, and changes in texture. Some women also have hair loss during or after pregnancy, or hair that feels dry or thin. Your hair will most likely return to normal after your baby is born.  Your breasts will continue to grow and be tender. A yellow discharge may leak from your breasts called colostrum.  Your belly button may stick out.  You may feel short of breath because of your expanding uterus.  You may notice the fetus "dropping," or moving lower in your abdomen.  You may have a bloody mucus discharge. This usually occurs a few days to a week before labor begins.  Your cervix becomes thin and soft (effaced) near your due date. WHAT TO EXPECT AT YOUR PRENATAL  EXAMS  You will have prenatal exams every 2 weeks until week 36. Then, you will have weekly prenatal exams. During a routine prenatal visit:  You will be weighed to make sure you and the fetus are growing normally.  Your blood pressure is taken.  Your abdomen will be measured to track your baby's growth.  The fetal heartbeat will be listened to.  Any test results from the previous visit will be discussed.  You may have a cervical check near your due date to see if you have effaced. At around 36 weeks, your caregiver will check your cervix. At the same time, your caregiver will also perform a test on the secretions of the vaginal tissue. This test is to determine if a type of bacteria, Group B streptococcus, is present. Your caregiver will explain this further. Your caregiver may ask you:  What your birth plan is.  How you are feeling.  If you are feeling the baby move.  If you have had any abnormal symptoms, such as leaking fluid, bleeding, severe headaches, or abdominal cramping.  If you have any questions. Other tests or screenings that may be performed during your third trimester include:  Blood tests that check for low iron levels (anemia).  Fetal testing to check the health, activity level, and growth of the fetus. Testing is done if you have certain medical conditions or if there are problems during the pregnancy. FALSE LABOR You may feel small, irregular contractions that   eventually go away. These are called Braxton Hicks contractions, or false labor. Contractions may last for hours, days, or even weeks before true labor sets in. If contractions come at regular intervals, intensify, or become painful, it is best to be seen by your caregiver.  SIGNS OF LABOR   Menstrual-like cramps.  Contractions that are 5 minutes apart or less.  Contractions that start on the top of the uterus and spread down to the lower abdomen and back.  A sense of increased pelvic pressure or back  pain.  A watery or bloody mucus discharge that comes from the vagina. If you have any of these signs before the 37th week of pregnancy, call your caregiver right away. You need to go to the hospital to get checked immediately. HOME CARE INSTRUCTIONS   Avoid all smoking, herbs, alcohol, and unprescribed drugs. These chemicals affect the formation and growth of the baby.  Follow your caregiver's instructions regarding medicine use. There are medicines that are either safe or unsafe to take during pregnancy.  Exercise only as directed by your caregiver. Experiencing uterine cramps is a good sign to stop exercising.  Continue to eat regular, healthy meals.  Wear a good support bra for breast tenderness.  Do not use hot tubs, steam rooms, or saunas.  Wear your seat belt at all times when driving.  Avoid raw meat, uncooked cheese, cat litter boxes, and soil used by cats. These carry germs that can cause birth defects in the baby.  Take your prenatal vitamins.  Try taking a stool softener (if your caregiver approves) if you develop constipation. Eat more high-fiber foods, such as fresh vegetables or fruit and whole grains. Drink plenty of fluids to keep your urine clear or pale yellow.  Take warm sitz baths to soothe any pain or discomfort caused by hemorrhoids. Use hemorrhoid cream if your caregiver approves.  If you develop varicose veins, wear support hose. Elevate your feet for 15 minutes, 3-4 times a day. Limit salt in your diet.  Avoid heavy lifting, wear low heal shoes, and practice good posture.  Rest a lot with your legs elevated if you have leg cramps or low back pain.  Visit your dentist if you have not gone during your pregnancy. Use a soft toothbrush to brush your teeth and be gentle when you floss.  A sexual relationship may be continued unless your caregiver directs you otherwise.  Do not travel far distances unless it is absolutely necessary and only with the approval  of your caregiver.  Take prenatal classes to understand, practice, and ask questions about the labor and delivery.  Make a trial run to the hospital.  Pack your hospital bag.  Prepare the baby's nursery.  Continue to go to all your prenatal visits as directed by your caregiver. SEEK MEDICAL CARE IF:  You are unsure if you are in labor or if your water has broken.  You have dizziness.  You have mild pelvic cramps, pelvic pressure, or nagging pain in your abdominal area.  You have persistent nausea, vomiting, or diarrhea.  You have a bad smelling vaginal discharge.  You have pain with urination. SEEK IMMEDIATE MEDICAL CARE IF:   You have a fever.  You are leaking fluid from your vagina.  You have spotting or bleeding from your vagina.  You have severe abdominal cramping or pain.  You have rapid weight loss or gain.  You have shortness of breath with chest pain.  You notice sudden or extreme swelling   of your face, hands, ankles, feet, or legs.  You have not felt your baby move in over an hour.  You have severe headaches that do not go away with medicine.  You have vision changes. Document Released: 03/18/2001 Document Revised: 03/29/2013 Document Reviewed: 05/25/2012 Exit[employer] Patient Information 2015 [employer]. This information is not intended to replace advice given to you by your health care provider. Make sure you discuss any questions you have with your health care provider.  

## 2014-02-23 NOTE — Progress Notes (Signed)
Patient reports a lot of pelvic pressure and is starting to have contractions; reports a lot of d/c on Monday and thought her water had broke

## 2014-03-01 ENCOUNTER — Encounter: Payer: Self-pay | Admitting: Family Medicine

## 2014-03-01 ENCOUNTER — Encounter: Payer: BC Managed Care – PPO | Admitting: Family Medicine

## 2014-03-11 ENCOUNTER — Encounter (HOSPITAL_COMMUNITY): Payer: Self-pay

## 2014-03-11 ENCOUNTER — Inpatient Hospital Stay (HOSPITAL_COMMUNITY)
Admission: AD | Admit: 2014-03-11 | Discharge: 2014-03-11 | Disposition: A | Discharge status: Home / Self Care | Payer: BC Managed Care – PPO | Source: Ambulatory Visit | Attending: Obstetrics & Gynecology | Admitting: Obstetrics & Gynecology

## 2014-03-11 DIAGNOSIS — Z3A37 37 weeks gestation of pregnancy: Secondary | ICD-10-CM | POA: Insufficient documentation

## 2014-03-11 DIAGNOSIS — Z3493 Encounter for supervision of normal pregnancy, unspecified, third trimester: Secondary | ICD-10-CM

## 2014-03-11 DIAGNOSIS — O471 False labor at or after 37 completed weeks of gestation: Secondary | ICD-10-CM | POA: Diagnosis present

## 2014-03-11 LAB — GROUP B STREP BY PCR: Group B strep by PCR: POSITIVE — AB

## 2014-03-11 LAB — OB RESULTS CONSOLE GBS: STREP GROUP B AG: POSITIVE

## 2014-03-11 NOTE — Discharge Instructions (Signed)
Braxton Hicks Contractions °Contractions of the uterus can occur throughout pregnancy. Contractions are not always a sign that you are in labor.  °WHAT ARE BRAXTON HICKS CONTRACTIONS?  °Contractions that occur before labor are called Braxton Hicks contractions, or false labor. Toward the end of pregnancy (32-34 weeks), these contractions can develop more often and may become more forceful. This is not true labor because these contractions do not result in opening (dilatation) and thinning of the cervix. They are sometimes difficult to tell apart from true labor because these contractions can be forceful and people have different pain tolerances. You should not feel embarrassed if you go to the hospital with false labor. Sometimes, the only way to tell if you are in true labor is for your health care provider to look for changes in the cervix. °If there are no prenatal problems or other health problems associated with the pregnancy, it is completely safe to be sent home with false labor and await the onset of true labor. °HOW CAN YOU TELL THE DIFFERENCE BETWEEN TRUE AND FALSE LABOR? °False Labor °· The contractions of false labor are usually shorter and not as hard as those of true labor.   °· The contractions are usually irregular.   °· The contractions are often felt in the front of the lower abdomen and in the groin.   °· The contractions may go away when you walk around or change positions while lying down.   °· The contractions get weaker and are shorter lasting as time goes on.   °· The contractions do not usually become progressively stronger, regular, and closer together as with true labor.   °True Labor °1. Contractions in true labor last 30-70 seconds, become very regular, usually become more intense, and increase in frequency.   °2. The contractions do not go away with walking.   °3. The discomfort is usually felt in the top of the uterus and spreads to the lower abdomen and low back.   °4. True labor can  be determined by your health care provider with an exam. This will show that the cervix is dilating and getting thinner.   °WHAT TO REMEMBER °· Keep up with your usual exercises and follow other instructions given by your health care provider.   °· Take medicines as directed by your health care provider.   °· Keep your regular prenatal appointments.   °· Eat and drink lightly if you think you are going into labor.   °· If Braxton Hicks contractions are making you uncomfortable:   °· Change your position from lying down or resting to walking, or from walking to resting.   °· Sit and rest in a tub of warm water.   °· Drink 2-3 glasses of water. Dehydration may cause these contractions.   °· Do slow and deep breathing several times an hour.   °WHEN SHOULD I SEEK IMMEDIATE MEDICAL CARE? °Seek immediate medical care if: °· Your contractions become stronger, more regular, and closer together.   °· You have fluid leaking or gushing from your vagina.   °· You have a fever.   °· You pass blood-tinged mucus.   °· You have vaginal bleeding.   °· You have continuous abdominal pain.   °· You have low back pain that you never had before.   °· You feel your baby's head pushing down and causing pelvic pressure.   °· Your baby is not moving as much as it used to.   °Document Released: 03/24/2005 Document Revised: 03/29/2013 Document Reviewed: 01/03/2013 °Exit[employer]® Patient Information ©2015 [employer]. This information is not intended to replace advice given to you by your health care   provider. Make sure you discuss any questions you have with your health care provider. ° °Fetal Movement Counts °Patient Name: __________________________________________________ Patient Due Date: ____________________ °Performing a fetal movement count is highly recommended in high-risk pregnancies, but it is good for every pregnant woman to do. Your health care provider may ask you to start counting fetal movements at 28 weeks of the pregnancy. Fetal  movements often increase: °· After eating a full meal. °· After physical activity. °· After eating or drinking something sweet or cold. °· At rest. °Pay attention to when you feel the baby is most active. This will help you notice a pattern of your baby's sleep and wake cycles and what factors contribute to an increase in fetal movement. It is important to perform a fetal movement count at the same time each day when your baby is normally most active.  °HOW TO COUNT FETAL MOVEMENTS °5. Find a quiet and comfortable area to sit or lie down on your left side. Lying on your left side provides the best blood and oxygen circulation to your baby. °6. Write down the day and time on a sheet of paper or in a journal. °7. Start counting kicks, flutters, swishes, rolls, or jabs in a 2-hour period. You should feel at least 10 movements within 2 hours. °8. If you do not feel 10 movements in 2 hours, wait 2-3 hours and count again. Look for a change in the pattern or not enough counts in 2 hours. °SEEK MEDICAL CARE IF: °· You feel less than 10 counts in 2 hours, tried twice. °· There is no movement in over an hour. °· The pattern is changing or taking longer each day to reach 10 counts in 2 hours. °· You feel the baby is not moving as he or she usually does. °Date: ____________ Movements: ____________ Start time: ____________ Finish time: ____________  °Date: ____________ Movements: ____________ Start time: ____________ Finish time: ____________ °Date: ____________ Movements: ____________ Start time: ____________ Finish time: ____________ °Date: ____________ Movements: ____________ Start time: ____________ Finish time: ____________ °Date: ____________ Movements: ____________ Start time: ____________ Finish time: ____________ °Date: ____________ Movements: ____________ Start time: ____________ Finish time: ____________ °Date: ____________ Movements: ____________ Start time: ____________ Finish time: ____________ °Date: ____________  Movements: ____________ Start time: ____________ Finish time: ____________  °Date: ____________ Movements: ____________ Start time: ____________ Finish time: ____________ °Date: ____________ Movements: ____________ Start time: ____________ Finish time: ____________ °Date: ____________ Movements: ____________ Start time: ____________ Finish time: ____________ °Date: ____________ Movements: ____________ Start time: ____________ Finish time: ____________ °Date: ____________ Movements: ____________ Start time: ____________ Finish time: ____________ °Date: ____________ Movements: ____________ Start time: ____________ Finish time: ____________ °Date: ____________ Movements: ____________ Start time: ____________ Finish time: ____________  °Date: ____________ Movements: ____________ Start time: ____________ Finish time: ____________ °Date: ____________ Movements: ____________ Start time: ____________ Finish time: ____________ °Date: ____________ Movements: ____________ Start time: ____________ Finish time: ____________ °Date: ____________ Movements: ____________ Start time: ____________ Finish time: ____________ °Date: ____________ Movements: ____________ Start time: ____________ Finish time: ____________ °Date: ____________ Movements: ____________ Start time: ____________ Finish time: ____________ °Date: ____________ Movements: ____________ Start time: ____________ Finish time: ____________  °Date: ____________ Movements: ____________ Start time: ____________ Finish time: ____________ °Date: ____________ Movements: ____________ Start time: ____________ Finish time: ____________ °Date: ____________ Movements: ____________ Start time: ____________ Finish time: ____________ °Date: ____________ Movements: ____________ Start time: ____________ Finish time: ____________ °Date: ____________ Movements: ____________ Start time: ____________ Finish time: ____________ °Date: ____________ Movements: ____________ Start time:  ____________ Finish time: ____________ °Date: ____________ Movements:   ____________ Start time: ____________ Finish time: ____________  °Date: ____________ Movements: ____________ Start time: ____________ Finish time: ____________ °Date: ____________ Movements: ____________ Start time: ____________ Finish time: ____________ °Date: ____________ Movements: ____________ Start time: ____________ Finish time: ____________ °Date: ____________ Movements: ____________ Start time: ____________ Finish time: ____________ °Date: ____________ Movements: ____________ Start time: ____________ Finish time: ____________ °Date: ____________ Movements: ____________ Start time: ____________ Finish time: ____________ °Date: ____________ Movements: ____________ Start time: ____________ Finish time: ____________  °Date: ____________ Movements: ____________ Start time: ____________ Finish time: ____________ °Date: ____________ Movements: ____________ Start time: ____________ Finish time: ____________ °Date: ____________ Movements: ____________ Start time: ____________ Finish time: ____________ °Date: ____________ Movements: ____________ Start time: ____________ Finish time: ____________ °Date: ____________ Movements: ____________ Start time: ____________ Finish time: ____________ °Date: ____________ Movements: ____________ Start time: ____________ Finish time: ____________ °Date: ____________ Movements: ____________ Start time: ____________ Finish time: ____________  °Date: ____________ Movements: ____________ Start time: ____________ Finish time: ____________ °Date: ____________ Movements: ____________ Start time: ____________ Finish time: ____________ °Date: ____________ Movements: ____________ Start time: ____________ Finish time: ____________ °Date: ____________ Movements: ____________ Start time: ____________ Finish time: ____________ °Date: ____________ Movements: ____________ Start time: ____________ Finish time: ____________ °Date:  ____________ Movements: ____________ Start time: ____________ Finish time: ____________ °Date: ____________ Movements: ____________ Start time: ____________ Finish time: ____________  °Date: ____________ Movements: ____________ Start time: ____________ Finish time: ____________ °Date: ____________ Movements: ____________ Start time: ____________ Finish time: ____________ °Date: ____________ Movements: ____________ Start time: ____________ Finish time: ____________ °Date: ____________ Movements: ____________ Start time: ____________ Finish time: ____________ °Date: ____________ Movements: ____________ Start time: ____________ Finish time: ____________ °Date: ____________ Movements: ____________ Start time: ____________ Finish time: ____________ °Document Released: 04/23/2006 Document Revised: 08/08/2013 Document Reviewed: 01/19/2012 °[employer]® Patient Information ©2015 [employer]. This information is not intended to replace advice given to you by your health care provider. Make sure you discuss any questions you have with your health care provider. ° °

## 2014-03-11 NOTE — MAU Note (Signed)
Contractions every 4-6 minutes since 430 am this morning. Denies LOF or VB. Positive fetal movement.

## 2014-03-14 ENCOUNTER — Ambulatory Visit (INDEPENDENT_AMBULATORY_CARE_PROVIDER_SITE_OTHER): Payer: BC Managed Care – PPO | Admitting: Obstetrics & Gynecology

## 2014-03-14 VITALS — BP 116/67 | HR 93 | Wt 172.9 lb

## 2014-03-14 DIAGNOSIS — Z3493 Encounter for supervision of normal pregnancy, unspecified, third trimester: Secondary | ICD-10-CM

## 2014-03-14 LAB — POCT URINALYSIS DIP (DEVICE)
Bilirubin Urine: NEGATIVE
Glucose, UA: NEGATIVE mg/dL
HGB URINE DIPSTICK: NEGATIVE
Ketones, ur: NEGATIVE mg/dL
LEUKOCYTES UA: NEGATIVE
NITRITE: NEGATIVE
Protein, ur: NEGATIVE mg/dL
SPECIFIC GRAVITY, URINE: 1.02 (ref 1.005–1.030)
Urobilinogen, UA: 0.2 mg/dL (ref 0.0–1.0)
pH: 6 (ref 5.0–8.0)

## 2014-03-14 NOTE — Progress Notes (Signed)
Patient reports contractions at night.

## 2014-03-14 NOTE — Patient Instructions (Signed)

## 2014-03-14 NOTE — Progress Notes (Signed)
Irregular contractions no LOF  Or VB. Labor precautions given

## 2014-03-21 ENCOUNTER — Ambulatory Visit (INDEPENDENT_AMBULATORY_CARE_PROVIDER_SITE_OTHER): Payer: BC Managed Care – PPO | Admitting: Obstetrics and Gynecology

## 2014-03-21 ENCOUNTER — Encounter: Payer: Self-pay | Admitting: Family Medicine

## 2014-03-21 ENCOUNTER — Inpatient Hospital Stay (HOSPITAL_COMMUNITY)
Admission: AD | Admit: 2014-03-21 | Discharge: 2014-03-22 | Disposition: A | Discharge status: Home / Self Care | Payer: BC Managed Care – PPO | Source: Ambulatory Visit | Attending: Obstetrics & Gynecology | Admitting: Obstetrics & Gynecology

## 2014-03-21 ENCOUNTER — Encounter (HOSPITAL_COMMUNITY): Payer: Self-pay | Admitting: *Deleted

## 2014-03-21 VITALS — BP 113/69 | HR 96 | Temp 98.1°F | Wt 173.3 lb

## 2014-03-21 DIAGNOSIS — O360131 Maternal care for anti-D [Rh] antibodies, third trimester, fetus 1: Secondary | ICD-10-CM

## 2014-03-21 DIAGNOSIS — Z3A39 39 weeks gestation of pregnancy: Secondary | ICD-10-CM | POA: Diagnosis not present

## 2014-03-21 DIAGNOSIS — Z3493 Encounter for supervision of normal pregnancy, unspecified, third trimester: Secondary | ICD-10-CM

## 2014-03-21 DIAGNOSIS — O471 False labor at or after 37 completed weeks of gestation: Secondary | ICD-10-CM | POA: Insufficient documentation

## 2014-03-21 LAB — POCT URINALYSIS DIP (DEVICE)
Bilirubin Urine: NEGATIVE
Glucose, UA: NEGATIVE mg/dL
Hgb urine dipstick: NEGATIVE
Ketones, ur: NEGATIVE mg/dL
Leukocytes, UA: NEGATIVE
Nitrite: NEGATIVE
Protein, ur: NEGATIVE mg/dL
Specific Gravity, Urine: 1.015 (ref 1.005–1.030)
Urobilinogen, UA: 0.2 mg/dL (ref 0.0–1.0)
pH: 8.5 — ABNORMAL HIGH (ref 5.0–8.0)

## 2014-03-21 LAB — POCT FERN TEST

## 2014-03-21 MED ORDER — FENTANYL CITRATE 0.05 MG/ML IJ SOLN
100.0000 ug | Freq: Once | INTRAMUSCULAR | Status: AC
  Administered 2014-03-21: 100 ug via INTRAMUSCULAR
  Filled 2014-03-21: qty 2

## 2014-03-21 NOTE — MAU Note (Signed)
Sharen CounterLisa Leftwich-Kirby, CNM at bedside. Speculum exam done and another fern slide collected. Pt. Offered pain medication. To recheck in one hour.

## 2014-03-21 NOTE — MAU Provider Note (Signed)
S: 23 y.o. [id number] @[redacted]w[redacted]d  pt of [facility] presents to MAU for r/o labor. She reports contractions every 2 minutes becoming more painful and some leakage of clear fluid x 1 episode.  She reports good fetal movement, denies vaginal bleeding, vaginal itching/burning, urinary symptoms, h/a, dizziness, n/v, or fever/chills.    O: BP 104/72 mmHg  Pulse 81  Resp 20  Ht 5\' 3"  (1.6 m)  Wt 78.019 kg (172 lb)  BMI 30.48 kg/m2  SpO2 99%  LMP 06/20/2013   Physical Examination: General appearance - alert, well appearing, and in no distress and acyanotic, in no respiratory distress  Speculum exam with negative pooling.  Ferning slide taken, negative on examination. Dilation: 1 Effacement (%): 50 Cervical Position: Posterior Presentation: Vertex Exam by:: Sarajane MarekBeth McNulty, RN   A: Labor evaluation  P: Fentanyl 100 mcg IM x 1 dose RN to recheck cervix in 1 hour  Sharen CounterLisa Leftwich-Kirby Certified Nurse-Midwife

## 2014-03-21 NOTE — MAU Note (Signed)
Pt reports contractions, ? Leaking fluid  

## 2014-03-21 NOTE — MAU Note (Signed)
Pt. States she has been contracting since 8pm tonight. Also, states she had some leakage of fluid around 10pm and went to the bathroom and after she stood up passed more fluid. Denies bleeding. Pt. States baby is moving well. Was seen in the doctors office today and was not dilated per pt. Next appointment is scheduled for next week.

## 2014-03-21 NOTE — Progress Notes (Signed)
Patient is doing well, reports vaginal irritation without discharge. Wet prep collected. FM/labor precautions reviewed

## 2014-03-21 NOTE — Progress Notes (Signed)
C/o of vaginal irritation-- denies discharge or odor.  C/o of pelvic pressure and contractions-- especially at night.

## 2014-03-22 DIAGNOSIS — O471 False labor at or after 37 completed weeks of gestation: Secondary | ICD-10-CM | POA: Diagnosis not present

## 2014-03-22 LAB — WET PREP, GENITAL
Clue Cells Wet Prep HPF POC: NONE SEEN
Trich, Wet Prep: NONE SEEN
WBC WET PREP: NONE SEEN
YEAST WET PREP: NONE SEEN

## 2014-03-22 MED ORDER — OXYCODONE-ACETAMINOPHEN 5-325 MG PO TABS
2.0000 | ORAL_TABLET | Freq: Once | ORAL | Status: AC
  Administered 2014-03-22: 2 via ORAL
  Filled 2014-03-22: qty 2

## 2014-03-22 NOTE — Progress Notes (Signed)
Patient seen in MAU for RN labor check.  After 2-3 hours, rechecked cervix (1/50/-3/vertex).  Contractions spacing.  Will give Percocet 5-325 x2 and discharge home for therapeutic rest.  +FM, no LOF/VB.  Shirlee LatchAngela Layali Freund, MD, MPH PGY-1,  [facility] 03/22/2014 1:50 AM

## 2014-03-24 ENCOUNTER — Inpatient Hospital Stay (HOSPITAL_COMMUNITY)
Admission: AD | Admit: 2014-03-24 | Discharge: 2014-03-24 | Disposition: A | Discharge status: Home / Self Care | Payer: BC Managed Care – PPO | Source: Ambulatory Visit | Attending: Obstetrics & Gynecology | Admitting: Obstetrics & Gynecology

## 2014-03-24 ENCOUNTER — Encounter (HOSPITAL_COMMUNITY): Payer: Self-pay | Admitting: *Deleted

## 2014-03-24 ENCOUNTER — Inpatient Hospital Stay (HOSPITAL_COMMUNITY): Payer: BC Managed Care – PPO

## 2014-03-24 DIAGNOSIS — O4703 False labor before 37 completed weeks of gestation, third trimester: Secondary | ICD-10-CM | POA: Insufficient documentation

## 2014-03-24 DIAGNOSIS — Z3A39 39 weeks gestation of pregnancy: Secondary | ICD-10-CM | POA: Insufficient documentation

## 2014-03-24 DIAGNOSIS — O368390 Maternal care for abnormalities of the fetal heart rate or rhythm, unspecified trimester, not applicable or unspecified: Secondary | ICD-10-CM | POA: Insufficient documentation

## 2014-03-24 NOTE — Discharge Instructions (Signed)
Keep scheduled office appointment for prenatal care. Drink 8-10 glasses of water per day.

## 2014-03-24 NOTE — MAU Note (Signed)
Pt C/O uc's, extreme lower pelvic pain.  "I feel like the head is going to come out."  Has mucus discharge, denies bleeding or LOF.

## 2014-03-24 NOTE — MAU Note (Signed)
Patient states she feels pressure in lower pelvis and suprapubic area, worse when she walks. "I am just miserable."

## 2014-03-24 NOTE — MAU Provider Note (Signed)
  History     CSN: [id number]  Arrival date and time: 03/24/14 1331   First Provider Initiated Contact with Patient 03/24/14 1500      Chief Complaint  Patient presents with  . Labor Eval   HPI This is a 23 y.o. female at 7569w4d who presents for labor eval .  I was asked to look at her FHR tracing which had one variable decel on it with one contraction. Otherwise reactive. Denies leaking or bleeding. Mild irregulat contractions only.  OB History    Gravida Para Term Preterm AB TAB SAB Ectopic Multiple Living   3 0 0 0 2 2 0 0 0 0       Past Medical History  Diagnosis Date  . Medical history non-contributory     Past Surgical History  Procedure Laterality Date  . Induced abortion    . Mouth surgery      History reviewed. No pertinent family history.  History  Substance Use Topics  . Smoking status: Never Smoker   . Smokeless tobacco: Never Used  . Alcohol Use: No     Comment: none with preg    Allergies: No Known Allergies  Prescriptions prior to admission  Medication Sig Dispense Refill Last Dose  . Prenatal Vit-Fe Fumarate-FA (PRENATAL MULTIVITAMIN) TABS tablet Take 1 tablet by mouth daily at 12 noon.   03/23/2014 at Unknown time    Review of Systems  Constitutional: Negative for fever and chills.  Gastrointestinal: Positive for abdominal pain. Negative for nausea and vomiting.  Neurological: Negative for dizziness and headaches.   Physical Exam   Blood pressure 105/70, pulse 90, temperature 98.4 F (36.9 C), temperature source Oral, resp. rate 18, last menstrual period 06/20/2013.  Physical Exam  Constitutional: She is oriented to person, place, and time. She appears well-developed and well-nourished. No distress.  Cardiovascular: Normal rate.   Respiratory: Effort normal.  GI: Soft. She exhibits no distension and no mass. There is no tenderness. There is no rebound and no guarding.  Genitourinary: Vagina normal. No vaginal discharge found.  Dilation:  1 Effacement (%): 50 Cervical Position: Middle Station: -3 Presentation: Vertex Exam by:: Artelia LarocheM. Williams, CNM   Musculoskeletal: Normal range of motion.  Neurological: She is alert and oriented to person, place, and time.  Skin: Skin is warm and dry.  Psychiatric: She has a normal mood and affect.    MAU Course  Procedures  MDM Ordered NST and BPP. If normal will d/c home  Assessment and Plan  Report to oncoming CNM  [facility] CountyWILLIAMS,MARIE 03/24/2014, 3:57 PM

## 2014-03-28 ENCOUNTER — Telehealth: Payer: Self-pay | Admitting: *Deleted

## 2014-03-28 ENCOUNTER — Other Ambulatory Visit: Payer: Self-pay | Admitting: *Deleted

## 2014-03-28 ENCOUNTER — Ambulatory Visit (HOSPITAL_COMMUNITY)
Admission: RE | Admit: 2014-03-28 | Discharge: 2014-03-28 | Disposition: A | Discharge status: Home / Self Care | Payer: BC Managed Care – PPO | Source: Ambulatory Visit | Attending: Family Medicine | Admitting: Family Medicine

## 2014-03-28 ENCOUNTER — Ambulatory Visit (INDEPENDENT_AMBULATORY_CARE_PROVIDER_SITE_OTHER): Payer: BC Managed Care – PPO | Admitting: Family Medicine

## 2014-03-28 VITALS — BP 98/60 | HR 84 | Temp 97.5°F | Wt 173.8 lb

## 2014-03-28 DIAGNOSIS — Z3A40 40 weeks gestation of pregnancy: Secondary | ICD-10-CM | POA: Insufficient documentation

## 2014-03-28 DIAGNOSIS — O480 Post-term pregnancy: Secondary | ICD-10-CM

## 2014-03-28 LAB — POCT URINALYSIS DIP (DEVICE)
Glucose, UA: NEGATIVE mg/dL
Hgb urine dipstick: NEGATIVE
KETONES UR: NEGATIVE mg/dL
Leukocytes, UA: NEGATIVE
Nitrite: NEGATIVE
PH: 5.5 (ref 5.0–8.0)
PROTEIN: NEGATIVE mg/dL
Specific Gravity, Urine: >=1.03 (ref 1.005–1.030)
Urobilinogen, UA: 1 mg/dL (ref 0.0–1.0)

## 2014-03-28 MED ORDER — ZOLPIDEM TARTRATE 10 MG PO TABS
10.0000 mg | ORAL_TABLET | Freq: Every evening | ORAL | Status: DC | PRN
Start: 2014-03-28 — End: 2014-04-06

## 2014-03-28 NOTE — Progress Notes (Signed)
C/o of pelvic pressure and irregular contractions.

## 2014-03-28 NOTE — Telephone Encounter (Signed)
Called pt and left message that her appt today @ 1300 will include prenatal visit with the doctor, fetal monitoring and US.  This is because her due date was yesterday. The appt will take approximately 1.5 hrs. She may call back if she has questions.

## 2014-03-28 NOTE — Progress Notes (Signed)
Patient is 23 y.o. [id number] 6560w1d.  +FM, denies LOF, VB, contractions.  Very uncomfortable when she sleeps.  rx ambien 10mg  #10 no RF R- NST, BPP today,  induction scheduled 12/28 @ 0730.

## 2014-03-29 ENCOUNTER — Telehealth (HOSPITAL_COMMUNITY): Payer: Self-pay | Admitting: *Deleted

## 2014-03-29 NOTE — Telephone Encounter (Signed)
Preadmission screen  

## 2014-04-03 ENCOUNTER — Inpatient Hospital Stay (HOSPITAL_COMMUNITY): Payer: BC Managed Care – PPO | Admitting: Anesthesiology

## 2014-04-03 ENCOUNTER — Inpatient Hospital Stay (HOSPITAL_COMMUNITY)
Admission: RE | Admit: 2014-04-03 | Discharge: 2014-04-06 | DRG: 775 | Disposition: A | Discharge status: Home / Self Care | Payer: BC Managed Care – PPO | Source: Ambulatory Visit | Attending: Obstetrics & Gynecology | Admitting: Obstetrics & Gynecology

## 2014-04-03 ENCOUNTER — Encounter (HOSPITAL_COMMUNITY): Payer: Self-pay

## 2014-04-03 DIAGNOSIS — O99824 Streptococcus B carrier state complicating childbirth: Secondary | ICD-10-CM | POA: Diagnosis present

## 2014-04-03 DIAGNOSIS — O480 Post-term pregnancy: Secondary | ICD-10-CM | POA: Diagnosis present

## 2014-04-03 DIAGNOSIS — Z3A41 41 weeks gestation of pregnancy: Secondary | ICD-10-CM | POA: Diagnosis present

## 2014-04-03 DIAGNOSIS — Z8759 Personal history of other complications of pregnancy, childbirth and the puerperium: Secondary | ICD-10-CM

## 2014-04-03 HISTORY — DX: Personal history of other complications of pregnancy, childbirth and the puerperium: Z87.59

## 2014-04-03 LAB — CBC
HCT: 36 % (ref 36.0–46.0)
HEMOGLOBIN: 12.6 g/dL (ref 12.0–15.0)
MCH: 31.6 pg (ref 26.0–34.0)
MCHC: 35 g/dL (ref 30.0–36.0)
MCV: 90.2 fL (ref 78.0–100.0)
Platelets: 150 10*3/uL (ref 150–400)
RBC: 3.99 MIL/uL (ref 3.87–5.11)
RDW: 13.6 % (ref 11.5–15.5)
WBC: 9.7 10*3/uL (ref 4.0–10.5)

## 2014-04-03 LAB — HIV ANTIBODY (ROUTINE TESTING W REFLEX): HIV: NONREACTIVE

## 2014-04-03 LAB — TYPE AND SCREEN
ABO/RH(D): O NEG
ANTIBODY SCREEN: NEGATIVE

## 2014-04-03 LAB — RPR

## 2014-04-03 MED ORDER — LACTATED RINGERS IV SOLN
500.0000 mL | INTRAVENOUS | Status: DC | PRN
  Administered 2014-04-03: 500 mL via INTRAVENOUS

## 2014-04-03 MED ORDER — EPHEDRINE 5 MG/ML INJ
10.0000 mg | INTRAVENOUS | Status: DC | PRN
  Filled 2014-04-03: qty 4
  Filled 2014-04-03: qty 2

## 2014-04-03 MED ORDER — OXYTOCIN 40 UNITS IN LACTATED RINGERS INFUSION - SIMPLE MED
1.0000 m[IU]/min | INTRAVENOUS | Status: DC
  Administered 2014-04-03: 2 m[IU]/min via INTRAVENOUS

## 2014-04-03 MED ORDER — CITRIC ACID-SODIUM CITRATE 334-500 MG/5ML PO SOLN
30.0000 mL | ORAL | Status: DC | PRN
Start: 2014-04-03 — End: 2014-04-04

## 2014-04-03 MED ORDER — EPHEDRINE 5 MG/ML INJ
10.0000 mg | INTRAVENOUS | Status: DC | PRN
  Filled 2014-04-03: qty 2

## 2014-04-03 MED ORDER — LIDOCAINE HCL (PF) 1 % IJ SOLN
INTRAMUSCULAR | Status: DC | PRN
  Administered 2014-04-03 (×2): 5 mL

## 2014-04-03 MED ORDER — FENTANYL CITRATE 0.05 MG/ML IJ SOLN: 100.0000 ug | Freq: Once | INTRAMUSCULAR | Status: DC

## 2014-04-03 MED ORDER — OXYCODONE-ACETAMINOPHEN 5-325 MG PO TABS: 2.0000 | ORAL_TABLET | ORAL | Status: DC | PRN

## 2014-04-03 MED ORDER — MISOPROSTOL 25 MCG QUARTER TABLET
25.0000 ug | ORAL_TABLET | ORAL | Status: DC | PRN
  Administered 2014-04-03: 25 ug via VAGINAL
  Filled 2014-04-03: qty 1
  Filled 2014-04-03: qty 0.25

## 2014-04-03 MED ORDER — PHENYLEPHRINE 40 MCG/ML (10ML) SYRINGE FOR IV PUSH (FOR BLOOD PRESSURE SUPPORT)
80.0000 ug | PREFILLED_SYRINGE | INTRAVENOUS | Status: DC | PRN
  Filled 2014-04-03: qty 2
  Filled 2014-04-03: qty 10

## 2014-04-03 MED ORDER — LACTATED RINGERS IV SOLN: 500.0000 mL | Freq: Once | INTRAVENOUS | Status: DC

## 2014-04-03 MED ORDER — OXYTOCIN 40 UNITS IN LACTATED RINGERS INFUSION - SIMPLE MED
62.5000 mL/h | INTRAVENOUS | Status: DC
  Filled 2014-04-03: qty 1000

## 2014-04-03 MED ORDER — PENICILLIN G POTASSIUM 5000000 UNITS IJ SOLR
5.0000 10*6.[IU] | Freq: Once | INTRAVENOUS | Status: AC
  Administered 2014-04-03: 5 10*6.[IU] via INTRAVENOUS
  Filled 2014-04-03: qty 5

## 2014-04-03 MED ORDER — ONDANSETRON HCL 4 MG/2ML IJ SOLN: 4.0000 mg | Freq: Four times a day (QID) | INTRAMUSCULAR | Status: DC | PRN

## 2014-04-03 MED ORDER — TERBUTALINE SULFATE 1 MG/ML IJ SOLN: 0.2500 mg | Freq: Once | INTRAMUSCULAR | Status: AC | PRN

## 2014-04-03 MED ORDER — LACTATED RINGERS IV SOLN
INTRAVENOUS | Status: DC
  Administered 2014-04-03 (×2): via INTRAVENOUS
  Administered 2014-04-03: 125 mL/h via INTRAVENOUS
  Administered 2014-04-04: 02:00:00 via INTRAVENOUS

## 2014-04-03 MED ORDER — PHENYLEPHRINE 40 MCG/ML (10ML) SYRINGE FOR IV PUSH (FOR BLOOD PRESSURE SUPPORT)
80.0000 ug | PREFILLED_SYRINGE | INTRAVENOUS | Status: DC | PRN
  Filled 2014-04-03: qty 2

## 2014-04-03 MED ORDER — FENTANYL CITRATE 0.05 MG/ML IJ SOLN
100.0000 ug | Freq: Once | INTRAMUSCULAR | Status: AC
  Administered 2014-04-03: 100 ug via INTRAVENOUS
  Filled 2014-04-03: qty 2

## 2014-04-03 MED ORDER — DEXTROSE 5 % IV SOLN
2.5000 10*6.[IU] | INTRAVENOUS | Status: DC
Start: 2014-04-03 — End: 2014-04-04
  Administered 2014-04-03 – 2014-04-04 (×4): 2.5 10*6.[IU] via INTRAVENOUS
  Filled 2014-04-03 (×7): qty 2.5

## 2014-04-03 MED ORDER — NALBUPHINE HCL 10 MG/ML IJ SOLN
10.0000 mg | INTRAMUSCULAR | Status: DC | PRN
  Administered 2014-04-03: 10 mg via INTRAVENOUS
  Filled 2014-04-03: qty 1

## 2014-04-03 MED ORDER — OXYCODONE-ACETAMINOPHEN 5-325 MG PO TABS: 1.0000 | ORAL_TABLET | ORAL | Status: DC | PRN

## 2014-04-03 MED ORDER — ACETAMINOPHEN 325 MG PO TABS: 650.0000 mg | ORAL_TABLET | ORAL | Status: DC | PRN

## 2014-04-03 MED ORDER — FENTANYL 2.5 MCG/ML BUPIVACAINE 1/10 % EPIDURAL INFUSION (WH - ANES)
14.0000 mL/h | INTRAMUSCULAR | Status: DC | PRN
  Administered 2014-04-03: 14 mL/h via EPIDURAL
  Filled 2014-04-03 (×2): qty 125

## 2014-04-03 MED ORDER — LIDOCAINE HCL (PF) 1 % IJ SOLN
30.0000 mL | INTRAMUSCULAR | Status: DC | PRN
  Filled 2014-04-03: qty 30

## 2014-04-03 MED ORDER — OXYTOCIN BOLUS FROM INFUSION
500.0000 mL | INTRAVENOUS | Status: DC
  Administered 2014-04-04: 500 mL via INTRAVENOUS

## 2014-04-03 MED ORDER — DIPHENHYDRAMINE HCL 50 MG/ML IJ SOLN: 12.5000 mg | INTRAMUSCULAR | Status: DC | PRN

## 2014-04-03 NOTE — Anesthesia Preprocedure Evaluation (Signed)

## 2014-04-03 NOTE — Progress Notes (Signed)
Patient ID: Rica Recordsiffany N Graf, female   DOB: 02/19/1991, 23 y.o.   MRN: [id number] Rica Recordsiffany N Rosato is a 23 y.o. G3P0020 at 7159w0d admitted for IOL postdates  Subjective: Comfortable.  Objective: BP 103/79 mmHg  Pulse 82  Temp(Src) 97.6 F (36.4 C) (Oral)  Resp 24  Ht 5\' 3"  (1.6 m)  Wt 78.019 kg (172 lb)  BMI 30.48 kg/m2  LMP 06/20/2013  Fetal Heart FHR: 140 bpm, variability: moderate,  accelerations:  Present,  decelerations:  Absent   Contractions: q 2 min  SVE:   Dilation: 1.5 Effacement (%): 50 Station: -2 Exam by:: M Early RNC-OB  1340: Cx posterior loose 1/2> FB placed  Assessment / Plan:  Labor: n/a. Cx unfavorable Fetal Wellbeing: Cat 1 Pain Control:  n/a Expected mode of delivery: NSVD  POE,DEIRDRE 04/03/2014, 3:41 PM

## 2014-04-03 NOTE — Progress Notes (Signed)
Patient ID: Brooke Mullen, female   DOB: 05/23/1990, 23 y.o.   MRN: [id number] Filed Vitals:   04/03/14 2105 04/03/14 2132 04/03/14 2202 04/03/14 2232  BP: 98/61 101/60 98/64 103/62  Pulse: 78 76 80 75  Temp:  98 F (36.7 C)    TempSrc:  Oral    Resp: 16 16 16 14   Height:      Weight:      SpO2:       FHR stable UCs irregular  Dilation: 5 Effacement (%): 70 Cervical Position: Middle Station: -2 Presentation: Vertex Exam by:: Artelia LarocheM. Devone Tousley CNM  IUPC inserted  Will start Pitocin

## 2014-04-03 NOTE — Progress Notes (Signed)
Patient ID: Brooke Mullen, female   DOB: 10/22/1990, 23 y.o.   MRN: [id number] Doing better after epidural  Filed Vitals:   04/03/14 2048 04/03/14 2049 04/03/14 2052 04/03/14 2105  BP: 106/81  107/71 98/61  Pulse: 73 69 77 78  Temp:      TempSrc:      Resp: 16  16 16   Height:      Weight:      SpO2: 100% 100%      FHR stable and reassuring UCs every 2 min  Dilation: 5 Effacement (%): 70 Cervical Position: Middle Station: -2 Presentation: Vertex Exam by:: T. Sprague RN  Will continue to observe

## 2014-04-03 NOTE — H&P (Signed)
Brooke Mullen is a 23 y.o. female G3P0020 at 733w0d by sure LMP (6d variation with US at 278w3d)presenting for IOL for postdates . Maternal Medical History:  Reason for admission: Nausea. Postdates admission for IOL  Contractions: Onset was 6-12 hours ago.   Frequency: irregular.   Duration is approximately 60 seconds.   Perceived severity is mild.    Fetal activity: Perceived fetal activity is normal.   Last perceived fetal movement was within the past hour.    Prenatal complications: no prenatal complications Prenatal Complications - Diabetes: none.    OB History    Gravida Para Term Preterm AB TAB SAB Ectopic Multiple Living   3 0 0 0 2 2 0 0 0 0      Past Medical History  Diagnosis Date  . Medical history non-contributory    Past Surgical History  Procedure Laterality Date  . Induced abortion    . Mouth surgery     Family History: family history is not on file. Social History:  reports that she has never smoked. She has never used smokeless tobacco. She reports that she does not drink alcohol or use illicit drugs.   Prenatal Transfer Tool  Maternal Diabetes: No Genetic Screening: Normal Maternal Ultrasounds/Referrals: Normal Fetal Ultrasounds or other Referrals:  None Maternal Substance Abuse:  No Significant Maternal Medications:  None Significant Maternal Lab Results:  Lab values include: Group B Strep positive Other Comments:  IV PCN started 04/03/14 1000  Review of Systems  Constitutional: Negative for fever.  Respiratory: Negative for cough.   Cardiovascular: Negative for palpitations and leg swelling.  Gastrointestinal: Negative for nausea, vomiting and abdominal pain.  Genitourinary: Negative for dysuria.  Neurological: Negative for headaches.  Psychiatric/Behavioral: Positive for depression.    Dilation: 1.5 Effacement (%): Thick Station: -2 Exam by:: Roshana Shuffield CNM Blood pressure 96/59, pulse 80, temperature 97.5 F (36.4 C), temperature source Oral,  resp. rate 20, height 5\' 3"  (1.6 m), weight 78.019 kg (172 lb), last menstrual period 06/20/2013.  cx soft, posterior 2 cm long/-2/-3  Maternal Exam:  Uterine Assessment: Contraction strength is mild.  Contraction frequency is irregular.   Abdomen: Estimated fetal weight is 7 1/2#.   Fetal presentation: vertex  Introitus: Normal vulva. Normal vagina.  Ferning test: not done.  Nitrazine test: not done. Amniotic fluid character: not assessed.  Pelvis: adequate for delivery.   Cervix: Cervix evaluated by digital exam.     Fetal Exam Fetal Monitor Review: Mode: ultrasound.   Baseline rate: 130.  Variability: moderate (6-25 bpm).   Pattern: accelerations present and no decelerations.    Fetal State Assessment: Category I - tracings are normal.     Physical Exam  Nursing note and vitals reviewed. Constitutional: She is oriented to person, place, and time. She appears well-developed and well-nourished. No distress.  HENT:  Head: Normocephalic.  Eyes: Pupils are equal, round, and reactive to light.  Neck: Normal range of motion. Neck supple. No thyromegaly present.  Cardiovascular: Normal rate, regular rhythm and normal heart sounds.   Respiratory: Effort normal and breath sounds normal.  GI: Soft. There is no tenderness.  Genitourinary: Vagina normal.  Musculoskeletal: Normal range of motion.  Neurological: She is alert and oriented to person, place, and time. She has normal reflexes.  Skin: Skin is warm and dry.  Psychiatric: She has a normal mood and affect. Her behavior is normal. Judgment and thought content normal.    Prenatal labs: ABO, Rh: --/--/O NEG (12/28 0755) Antibody: NEG (12/28 0755)  Rubella: 0.64 (06/03 1141) RPR: NON REAC (10/07 1100)  HBsAg: NEGATIVE (06/03 1141)  HIV: NONREACTIVE (10/07 1100)  GBS: Positive (12/05 0959)  GCT 109\  Assessment/Plan: 23 yo G3P0020 at 5356w0d  Category 1 FHR Cx not favorable> cytotec for ripening GBS carriage> IV  PCN    Brooke Mullen 04/03/2014, 9:41 AM

## 2014-04-03 NOTE — Anesthesia Procedure Notes (Signed)
Epidural Patient location during procedure: OB Start time: 04/03/2014 8:30 PM  Staffing Anesthesiologist: Brayton CavesJACKSON, Sinclair Arrazola Performed by: anesthesiologist   Preanesthetic Checklist Completed: patient identified, site marked, surgical consent, pre-op evaluation, timeout performed, IV checked, risks and benefits discussed and monitors and equipment checked  Epidural Patient position: sitting Prep: site prepped and draped and DuraPrep Patient monitoring: continuous pulse ox and blood pressure Approach: midline Location: L3-L4 Injection technique: LOR air  Needle:  Needle type: Tuohy  Needle gauge: 17 G Needle length: 9 cm and 9 Needle insertion depth: 5 cm cm Catheter type: closed end flexible Catheter size: 19 Gauge Catheter at skin depth: 10 cm Test dose: negative  Assessment Events: blood not aspirated, injection not painful, no injection resistance, negative IV test and no paresthesia  Additional Notes Patient identified.  Risk benefits discussed including failed block, incomplete pain control, headache, nerve damage, paralysis, blood pressure changes, nausea, vomiting, reactions to medication both toxic or allergic, and postpartum back pain.  Patient expressed understanding and wished to proceed.  All questions were answered.  Sterile technique used throughout procedure and epidural site dressed with sterile barrier dressing. No paresthesia or other complications noted.The patient did not experience any signs of intravascular injection such as tinnitus or metallic taste in mouth nor signs of intrathecal spread such as rapid motor block. Please see nursing notes for vital signs.

## 2014-04-04 ENCOUNTER — Encounter (HOSPITAL_COMMUNITY): Payer: Self-pay

## 2014-04-04 ENCOUNTER — Encounter: Payer: BC Managed Care – PPO | Admitting: Family Medicine

## 2014-04-04 DIAGNOSIS — Z3A41 41 weeks gestation of pregnancy: Secondary | ICD-10-CM

## 2014-04-04 DIAGNOSIS — O99824 Streptococcus B carrier state complicating childbirth: Secondary | ICD-10-CM

## 2014-04-04 MED ORDER — OXYCODONE-ACETAMINOPHEN 5-325 MG PO TABS: 2.0000 | ORAL_TABLET | ORAL | Status: DC | PRN

## 2014-04-04 MED ORDER — IBUPROFEN 600 MG PO TABS
600.0000 mg | ORAL_TABLET | Freq: Four times a day (QID) | ORAL | Status: DC
  Administered 2014-04-04 – 2014-04-06 (×10): 600 mg via ORAL
  Filled 2014-04-04 (×10): qty 1

## 2014-04-04 MED ORDER — DIBUCAINE 1 % RE OINT: 1.0000 "application " | TOPICAL_OINTMENT | RECTAL | Status: DC | PRN

## 2014-04-04 MED ORDER — SENNOSIDES-DOCUSATE SODIUM 8.6-50 MG PO TABS
2.0000 | ORAL_TABLET | ORAL | Status: DC
  Administered 2014-04-05 (×2): 2 via ORAL
  Filled 2014-04-04 (×2): qty 2

## 2014-04-04 MED ORDER — ZOLPIDEM TARTRATE 5 MG PO TABS
5.0000 mg | ORAL_TABLET | Freq: Every evening | ORAL | Status: DC | PRN
Start: 2014-04-04 — End: 2014-04-06

## 2014-04-04 MED ORDER — LACTATED RINGERS IV SOLN
INTRAVENOUS | Status: DC
  Administered 2014-04-04: 100 mL/h via INTRAUTERINE

## 2014-04-04 MED ORDER — LANOLIN HYDROUS EX OINT
TOPICAL_OINTMENT | CUTANEOUS | Status: DC | PRN
Start: 2014-04-04 — End: 2014-04-06

## 2014-04-04 MED ORDER — MEASLES, MUMPS & RUBELLA VAC ~~LOC~~ INJ
0.5000 mL | INJECTION | Freq: Once | SUBCUTANEOUS | Status: AC
Start: 2014-04-05 — End: 2014-04-06
  Administered 2014-04-06: 0.5 mL via SUBCUTANEOUS
  Filled 2014-04-04 (×2): qty 0.5

## 2014-04-04 MED ORDER — ONDANSETRON HCL 4 MG PO TABS: 4.0000 mg | ORAL_TABLET | ORAL | Status: DC | PRN

## 2014-04-04 MED ORDER — DIPHENHYDRAMINE HCL 25 MG PO CAPS: 25.0000 mg | ORAL_CAPSULE | Freq: Four times a day (QID) | ORAL | Status: DC | PRN

## 2014-04-04 MED ORDER — WITCH HAZEL-GLYCERIN EX PADS: 1.0000 "application " | MEDICATED_PAD | CUTANEOUS | Status: DC | PRN

## 2014-04-04 MED ORDER — BENZOCAINE-MENTHOL 20-0.5 % EX AERO
1.0000 "application " | INHALATION_SPRAY | CUTANEOUS | Status: DC | PRN
  Administered 2014-04-04: 1 via TOPICAL
  Filled 2014-04-04 (×2): qty 56

## 2014-04-04 MED ORDER — SIMETHICONE 80 MG PO CHEW
80.0000 mg | CHEWABLE_TABLET | ORAL | Status: DC | PRN
Start: 2014-04-04 — End: 2014-04-06
  Administered 2014-04-05: 80 mg via ORAL

## 2014-04-04 MED ORDER — PRENATAL MULTIVITAMIN CH
1.0000 | ORAL_TABLET | Freq: Every day | ORAL | Status: DC
  Administered 2014-04-04 – 2014-04-06 (×3): 1 via ORAL
  Filled 2014-04-04 (×4): qty 1

## 2014-04-04 MED ORDER — OXYCODONE-ACETAMINOPHEN 5-325 MG PO TABS
1.0000 | ORAL_TABLET | ORAL | Status: DC | PRN
  Administered 2014-04-04 – 2014-04-06 (×8): 1 via ORAL
  Filled 2014-04-04 (×8): qty 1

## 2014-04-04 MED ORDER — ONDANSETRON HCL 4 MG/2ML IJ SOLN
4.0000 mg | INTRAMUSCULAR | Status: DC | PRN
Start: 2014-04-04 — End: 2014-04-06

## 2014-04-04 MED ORDER — TETANUS-DIPHTH-ACELL PERTUSSIS 5-2.5-18.5 LF-MCG/0.5 IM SUSP: 0.5000 mL | Freq: Once | INTRAMUSCULAR | Status: DC

## 2014-04-04 NOTE — Progress Notes (Signed)
svd of viable girl at 0443, apgars 9/9, repair of laceration by CNM

## 2014-04-04 NOTE — Progress Notes (Signed)
Patient ID: Brooke Mullen, female   DOB: 07/26/1990, 23 y.o.   MRN: [id number] Filed Vitals:   04/03/14 2202 04/03/14 2232 04/03/14 2302 04/03/14 2332  BP: 98/64 103/62 111/76 108/70  Pulse: 80 75 76 79  Temp:    97.9 F (36.6 C)  TempSrc:    Oral  Resp: 16 14 18 16   Height:      Weight:      SpO2:       FHR reactive but does have variable decels with UCs UCs every 2 - 3 min  Dilation: 5 Effacement (%): 70 Cervical Position: Middle Station: -2 Presentation: Vertex Exam by:: Artelia LarocheM. Williams CNM  Will start amnioinfusion

## 2014-04-04 NOTE — Progress Notes (Signed)
Patient ID: Brooke Mullen, female   DOB: 02/18/1991, 23 y.o.   MRN: [id number] Feeling some pressure  Filed Vitals:   04/04/14 0202  BP: 110/70  Pulse: 81  Temp:   Resp:    FHR stable UCs every 2 min, > 250MVU  Dilation: 10 Effacement (%): 100 Cervical Position: Anterior Station: -1 Presentation: Vertex Exam by:: WIlliams  Will labor down and then push

## 2014-04-04 NOTE — Lactation Note (Addendum)
This note was copied from the chart of Brooke Mullen. Lactation Consultation Note      Initial consult with this mom of a term baby, now 346 hours old. Mom was holding the baby when I walked in the room. She reports the baby has fed twice and did well. I showed mom how to position for cross cradle, and did teaching from the baby and Me book on breast feeding, and reviewed lacattion services with mom. The baby was asleep and not cuing, I put the baby in her crib as mom asked, so she could nap. Skin to skin encouraged, especially if the baby has not fed in 3 hours. Mom knows to call for questions/concerns. Mom has easily expressed colostrum.  Patient Name: Brooke Brooke Mullen NWGNF'AToday's Date: 04/04/2014 Reason for consult: Initial assessment   Maternal Data Formula Feeding for Exclusion: No Has patient been taught Hand Expression?: Yes Does the patient have breastfeeding experience prior to this delivery?: No  Feeding Feeding Type: Breast Fed Length of feed: 10 min  LATCH Score/Interventions Latch: Too sleepy or reluctant, no latch achieved, no sucking elicited. Intervention(s): Skin to skin;Teach feeding cues;Waking techniques Intervention(s): Adjust position;Assist with latch;Breast compression  Audible Swallowing: None Intervention(s): Skin to skin;Hand expression  Type of Nipple: Flat (semi flat but very compressible) Intervention(s): No intervention needed  Comfort (Breast/Nipple): Soft / non-tender     Hold (Positioning): Assistance needed to correctly position infant at breast and maintain latch. Intervention(s): Breastfeeding basics reviewed;Support Pillows;Position options;Skin to skin  LATCH Score: 4  Lactation Tools Discussed/Used     Consult Status Consult Status: Follow-up Date: 04/05/14 Follow-up type: In-patient    Alfred LevinsLee, Paul Torpey Anne 04/04/2014, 11:23 AM

## 2014-04-04 NOTE — Progress Notes (Signed)
Pt fully dilated +3, CNM notified, will begin to push

## 2014-04-05 MED ORDER — INFLUENZA VAC SPLIT QUAD 0.5 ML IM SUSY
0.5000 mL | PREFILLED_SYRINGE | INTRAMUSCULAR | Status: AC
  Administered 2014-04-06: 0.5 mL via INTRAMUSCULAR
  Filled 2014-04-05: qty 0.5

## 2014-04-05 NOTE — Progress Notes (Signed)
Post Partum Day 1 Subjective: no complaints, up ad lib, voiding and tolerating PO, small lochia, plans to breastfeed, Depo-Provera  Objective: Blood pressure 103/60, pulse 78, temperature 97.7 F (36.5 C), temperature source Oral, resp. rate 18, height 5\' 3"  (1.6 m), weight 78.019 kg (172 lb), last menstrual period 06/20/2013, SpO2 99 %, unknown if currently breastfeeding.  Physical Exam:  General: alert, cooperative and no distress Lochia:normal flow Chest: CTAB Heart: RRR no m/r/g Abdomen: +BS, soft, nontender,  Uterine Fundus: firm DVT Evaluation: No evidence of DVT seen on physical exam. Extremities: no edema   Recent Labs  04/03/14 0755  HGB 12.6  HCT 36.0    Assessment/Plan: Plan for discharge tomorrow, Breastfeeding and Lactation consult   LOS: 2 days   CRESENZO-DISHMAN,Teighlor Korson 04/05/2014, 7:46 AM

## 2014-04-05 NOTE — Lactation Note (Signed)
This note was copied from the chart of Brooke Elmer Balesiffany Erhart. Lactation Consultation Note     Mom c/o sore nipples, now 35 hours pp. Baby with strong suck on my finger, bottom lip under my finger, but baby has blistered lips, and tongue frenulum appears short. I fitted mom with 20 nipple shilel, which was much more comfortable, and had her pump with DEP every 3 hours, and offer EBM to baby. I gave mom comfort gels also  Patient Name: Brooke Mullen ZHYQM'VToday's Date: 04/05/2014 Reason for consult: Follow-up assessment   Maternal Data    Feeding Feeding Type: Breast Fed Length of feed: 15 min  LATCH Score/Interventions Latch: Grasps breast easily, tongue down, lips flanged, rhythmical sucking. (mom feeling biting - 20 nipple shiled - much more comfortable) Intervention(s): Skin to skin;Teach feeding cues Intervention(s): Assist with latch  Audible Swallowing: None  Type of Nipple: Everted at rest and after stimulation Intervention(s): Double electric pump  Comfort (Breast/Nipple): Filling, red/small blisters or bruises, mild/mod discomfort  Problem noted: Filling;Cracked, bleeding, blisters, bruises;Severe discomfort Interventions  (Cracked/bleeding/bruising/blister): Expressed breast milk to nipple;Double electric pump Interventions (Severe discomfort): Double electric pum  Hold (Positioning): Assistance needed to correctly position infant at breast and maintain latch. Intervention(s): Breastfeeding basics reviewed;Support Pillows;Position options;Skin to skin  LATCH Score: 6  Lactation Tools Discussed/Used Tools: Comfort gels;Nipple Dorris CarnesShields;Flanges Nipple shield size: 20 Flange Size:  (21) Pump Review: Setup, frequency, and cleaning;Milk Storage;Other (comment) (premie settingc) Initiated by:: clee Date initiated:: 04/05/14   Consult Status Consult Status: Follow-up Date: 04/06/14 Follow-up type: In-patient    Alfred LevinsLee, Brooke Mullen 04/05/2014, 4:30 PM

## 2014-04-05 NOTE — Anesthesia Postprocedure Evaluation (Signed)
  Anesthesia Post-op Note  Patient: Brooke Mullen  Procedure(s) Performed: * No procedures listed *  Patient Location: Mother/Baby  Anesthesia Type:Epidural  Level of Consciousness: awake, alert , oriented and patient cooperative  Airway and Oxygen Therapy: Patient Spontanous Breathing  Post-op Pain: mild  Post-op Assessment: Patient's Cardiovascular Status Stable, Respiratory Function Stable, No headache, No backache, No residual numbness and No residual motor weakness  Post-op Vital Signs: stable  Last Vitals:  Filed Vitals:   04/05/14 0540  BP: 103/60  Pulse: 78  Temp: 36.5 C  Resp: 18    Complications: No apparent anesthesia complications

## 2014-04-06 MED ORDER — IBUPROFEN 600 MG PO TABS: 600.0000 mg | ORAL_TABLET | Freq: Four times a day (QID) | ORAL | Status: DC

## 2014-04-06 NOTE — Discharge Summary (Signed)
Obstetric Discharge Summary Reason for Admission: postdate induction of labor Prenatal Procedures: none Intrapartum Procedures: spontaneous vaginal delivery Postpartum Procedures: none Complications-Operative and Postpartum: 2nd degree perineal laceration HEMOGLOBIN  Date Value Ref Range Status  04/03/2014 12.6 12.0 - 15.0 g/dL Final   HCT  Date Value Ref Range Status  04/03/2014 36.0 36.0 - 46.0 % Final   Delivery Note At 4:43 AM a viable and healthy female was delivered via Vaginal, Spontaneous Delivery (Presentation: ROA). APGAR: 9, 9; weight 8 lb 0.9 oz (3655 g).  Placenta status: Intact, Spontaneous. Cord: 3 vessels with the following complications: Nuchal cord  No difficulty with shoulders  Anesthesia: Epidural  Episiotomy: None Lacerations: 2nd degree Suture Repair: 3.0 monocryl Est. Blood Loss (mL): 200   Physical Exam:  General: alert, cooperative and no distress Lochia: appropriate Uterine Fundus: firm DVT Evaluation: No evidence of DVT seen on physical exam. No cords or calf tenderness.  Discharge Diagnoses: Term Pregnancy-delivered  Discharge Information: Date: 04/06/2014 Activity: pelvic rest Diet: routine Medications: PNV and Ibuprofen Condition: stable Instructions: refer to practice specific booklet Discharge to: home Follow-up Information    [facility] On [facility]10/2014.   Specialty:  Obstetrics and Gynecology   Why:  at 12:45pm   Contact information:   [address] [place] [id number] [phone]      Newborn Data: Live born female  Birth Weight: 8 lb 0.9 oz (3654 g) APGAR: 9, 9  Home with mother.  Brooke Mullen 04/06/2014, 10:44 AM

## 2014-04-06 NOTE — Discharge Instructions (Signed)
Contraception Choices Contraception (birth control) is the use of any methods or devices to prevent pregnancy. Below are some methods to help avoid pregnancy. HORMONAL METHODS   Contraceptive implant. This is a thin, plastic tube containing progesterone hormone. It does not contain estrogen hormone. Your health care provider inserts the tube in the inner part of the upper arm. The tube can remain in place for up to 3 years. After 3 years, the implant must be removed. The implant prevents the ovaries from releasing an egg (ovulation), thickens the cervical mucus to prevent sperm from entering the uterus, and thins the lining of the inside of the uterus.  Progesterone-only injections. These injections are given every 3 months by your health care provider to prevent pregnancy. This synthetic progesterone hormone stops the ovaries from releasing eggs. It also thickens cervical mucus and changes the uterine lining. This makes it harder for sperm to survive in the uterus.  Birth control pills. These pills contain estrogen and progesterone hormone. They work by preventing the ovaries from releasing eggs (ovulation). They also cause the cervical mucus to thicken, preventing the sperm from entering the uterus. Birth control pills are prescribed by a health care provider.Birth control pills can also be used to treat heavy periods.  Minipill. This type of birth control pill contains only the progesterone hormone. They are taken every day of each month and must be prescribed by your health care provider.  Birth control patch. The patch contains hormones similar to those in birth control pills. It must be changed once a week and is prescribed by a health care provider.  Vaginal ring. The ring contains hormones similar to those in birth control pills. It is left in the vagina for 3 weeks, removed for 1 week, and then a new one is put back in place. The patient must be comfortable inserting and removing the ring  from the vagina.A health care provider's prescription is necessary.  Emergency contraception. Emergency contraceptives prevent pregnancy after unprotected sexual intercourse. This pill can be taken right after sex or up to 5 days after unprotected sex. It is most effective the sooner you take the pills after having sexual intercourse. Most emergency contraceptive pills are available without a prescription. Check with your pharmacist. Do not use emergency contraception as your only form of birth control. BARRIER METHODS   Female condom. This is a thin sheath (latex or rubber) that is worn over the penis during sexual intercourse. It can be used with spermicide to increase effectiveness.  Female condom. This is a soft, loose-fitting sheath that is put into the vagina before sexual intercourse.  Diaphragm. This is a soft, latex, dome-shaped barrier that must be fitted by a health care provider. It is inserted into the vagina, along with a spermicidal jelly. It is inserted before intercourse. The diaphragm should be left in the vagina for 6 to 8 hours after intercourse.  Cervical cap. This is a round, soft, latex or plastic cup that fits over the cervix and must be fitted by a health care provider. The cap can be left in place for up to 48 hours after intercourse.  Sponge. This is a soft, circular piece of polyurethane foam. The sponge has spermicide in it. It is inserted into the vagina after wetting it and before sexual intercourse.  Spermicides. These are chemicals that kill or block sperm from entering the cervix and uterus. They come in the form of creams, jellies, suppositories, foam, or tablets. They do not require a  prescription. They are inserted into the vagina with an applicator before having sexual intercourse. The process must be repeated every time you have sexual intercourse. INTRAUTERINE CONTRACEPTION  Intrauterine device (IUD). This is a T-shaped device that is put in a woman's uterus  during a menstrual period to prevent pregnancy. There are 2 types:  Copper IUD. This type of IUD is wrapped in copper wire and is placed inside the uterus. Copper makes the uterus and fallopian tubes produce a fluid that kills sperm. It can stay in place for 10 years.  Hormone IUD. This type of IUD contains the hormone progestin (synthetic progesterone). The hormone thickens the cervical mucus and prevents sperm from entering the uterus, and it also thins the uterine lining to prevent implantation of a fertilized egg. The hormone can weaken or kill the sperm that get into the uterus. It can stay in place for 3-5 years, depending on which type of IUD is used. PERMANENT METHODS OF CONTRACEPTION  Female tubal ligation. This is when the woman's fallopian tubes are surgically sealed, tied, or blocked to prevent the egg from traveling to the uterus.  Hysteroscopic sterilization. This involves placing a small coil or insert into each fallopian tube. Your doctor uses a technique called hysteroscopy to do the procedure. The device causes scar tissue to form. This results in permanent blockage of the fallopian tubes, so the sperm cannot fertilize the egg. It takes about 3 months after the procedure for the tubes to become blocked. You must use another form of birth control for these 3 months.  Female sterilization. This is when the female has the tubes that carry sperm tied off (vasectomy).This blocks sperm from entering the vagina during sexual intercourse. After the procedure, the man can still ejaculate fluid (semen). NATURAL PLANNING METHODS  Natural family planning. This is not having sexual intercourse or using a barrier method (condom, diaphragm, cervical cap) on days the woman could become pregnant.  Calendar method. This is keeping track of the length of each menstrual cycle and identifying when you are fertile.  Ovulation method. This is avoiding sexual intercourse during ovulation.  Symptothermal  method. This is avoiding sexual intercourse during ovulation, using a thermometer and ovulation symptoms.  Post-ovulation method. This is timing sexual intercourse after you have ovulated. Regardless of which type or method of contraception you choose, it is important that you use condoms to protect against the transmission of sexually transmitted infections (STIs). Talk with your health care provider about which form of contraception is most appropriate for you. Document Released: 03/24/2005 Document Revised: 03/29/2013 Document Reviewed: 09/16/2012 [facility] Patient Information 2015 [employer], [place]. This information is not intended to replace advice given to you by your health care provider. Make sure you discuss any questions you have with your health care provider. Breastfeeding Deciding to breastfeed is one of the best choices you can make for you and your baby. A change in hormones during pregnancy causes your breast tissue to grow and increases the number and size of your milk ducts. These hormones also allow proteins, sugars, and fats from your blood supply to make breast milk in your milk-producing glands. Hormones prevent breast milk from being released before your baby is born as well as prompt milk flow after birth. Once breastfeeding has begun, thoughts of your baby, as well as his or her sucking or crying, can stimulate the release of milk from your milk-producing glands.  BENEFITS OF BREASTFEEDING For Your Baby  Your first milk (colostrum) helps your  baby's digestive system function better.   There are antibodies in your milk that help your baby fight off infections.   Your baby has a lower incidence of asthma, allergies, and sudden infant death syndrome.   The nutrients in breast milk are better for your baby than infant formulas and are designed uniquely for your baby's needs.   Breast milk improves your baby's brain development.   Your baby is less likely to develop other  conditions, such as childhood obesity, asthma, or type 2 diabetes mellitus.  For You   Breastfeeding helps to create a very special bond between you and your baby.   Breastfeeding is convenient. Breast milk is always available at the correct temperature and costs nothing.   Breastfeeding helps to burn calories and helps you lose the weight gained during pregnancy.   Breastfeeding makes your uterus contract to its prepregnancy size faster and slows bleeding (lochia) after you give birth.   Breastfeeding helps to lower your risk of developing type 2 diabetes mellitus, osteoporosis, and breast or ovarian cancer later in life. SIGNS THAT YOUR BABY IS HUNGRY Early Signs of Hunger  Increased alertness or activity.  Stretching.  Movement of the head from side to side.  Movement of the head and opening of the mouth when the corner of the mouth or cheek is stroked (rooting).  Increased sucking sounds, smacking lips, cooing, sighing, or squeaking.  Hand-to-mouth movements.  Increased sucking of fingers or hands. Late Signs of Hunger  Fussing.  Intermittent crying. Extreme Signs of Hunger Signs of extreme hunger will require calming and consoling before your baby will be able to breastfeed successfully. Do not wait for the following signs of extreme hunger to occur before you initiate breastfeeding:   Restlessness.  A loud, strong cry.   Screaming. BREASTFEEDING BASICS Breastfeeding Initiation  Find a comfortable place to sit or lie down, with your neck and back well supported.  Place a pillow or rolled up blanket under your baby to bring him or her to the level of your breast (if you are seated). Nursing pillows are specially designed to help support your arms and your baby while you breastfeed.  Make sure that your baby's abdomen is facing your abdomen.   Gently massage your breast. With your fingertips, massage from your chest wall toward your nipple in a circular  motion. This encourages milk flow. You may need to continue this action during the feeding if your milk flows slowly.  Support your breast with 4 fingers underneath and your thumb above your nipple. Make sure your fingers are well away from your nipple and your baby's mouth.   Stroke your baby's lips gently with your finger or nipple.   When your baby's mouth is open wide enough, quickly bring your baby to your breast, placing your entire nipple and as much of the colored area around your nipple (areola) as possible into your baby's mouth.   More areola should be visible above your baby's upper lip than below the lower lip.   Your baby's tongue should be between his or her lower gum and your breast.   Ensure that your baby's mouth is correctly positioned around your nipple (latched). Your baby's lips should create a seal on your breast and be turned out (everted).  It is common for your baby to suck about 2-3 minutes in order to start the flow of breast milk. Latching Teaching your baby how to latch on to your breast properly is very important.  An improper latch can cause nipple pain and decreased milk supply for you and poor weight gain in your baby. Also, if your baby is not latched onto your nipple properly, he or she may swallow some air during feeding. This can make your baby fussy. Burping your baby when you switch breasts during the feeding can help to get rid of the air. However, teaching your baby to latch on properly is still the best way to prevent fussiness from swallowing air while breastfeeding. Signs that your baby has successfully latched on to your nipple:    Silent tugging or silent sucking, without causing you pain.   Swallowing heard between every 3-4 sucks.    Muscle movement above and in front of his or her ears while sucking.  Signs that your baby has not successfully latched on to nipple:   Sucking sounds or smacking sounds from your baby while  breastfeeding.  Nipple pain. If you think your baby has not latched on correctly, slip your finger into the corner of your baby's mouth to break the suction and place it between your baby's gums. Attempt breastfeeding initiation again. Signs of Successful Breastfeeding Signs from your baby:   A gradual decrease in the number of sucks or complete cessation of sucking.   Falling asleep.   Relaxation of his or her body.   Retention of a small amount of milk in his or her mouth.   Letting go of your breast by himself or herself. Signs from you:  Breasts that have increased in firmness, weight, and size 1-3 hours after feeding.   Breasts that are softer immediately after breastfeeding.  Increased milk volume, as well as a change in milk consistency and color by the fifth day of breastfeeding.   Nipples that are not sore, cracked, or bleeding. Signs That Your Pecola LeisureBaby is Getting Enough Milk  Wetting at least 3 diapers in a 24-hour period. The urine should be clear and pale yellow by age 14 days.  At least 3 stools in a 24-hour period by age 14 days. The stool should be soft and yellow.  At least 3 stools in a 24-hour period by age 96 days. The stool should be seedy and yellow.  No loss of weight greater than 10% of birth weight during the first 723 days of age.  Average weight gain of 4-7 ounces (113-198 g) per week after age 43 days.  Consistent daily weight gain by age 14 days, without weight loss after the age of 2 weeks. After a feeding, your baby may spit up a small amount. This is common. BREASTFEEDING FREQUENCY AND DURATION Frequent feeding will help you make more milk and can prevent sore nipples and breast engorgement. Breastfeed when you feel the need to reduce the fullness of your breasts or when your baby shows signs of hunger. This is called "breastfeeding on demand." Avoid introducing a pacifier to your baby while you are working to establish breastfeeding (the first 4-6  weeks after your baby is born). After this time you may choose to use a pacifier. Research has shown that pacifier use during the first year of a baby's life decreases the risk of sudden infant death syndrome (SIDS). Allow your baby to feed on each breast as long as he or she wants. Breastfeed until your baby is finished feeding. When your baby unlatches or falls asleep while feeding from the first breast, offer the second breast. Because newborns are often sleepy in the first few weeks of  life, you may need to awaken your baby to get him or her to feed. Breastfeeding times will vary from baby to baby. However, the following rules can serve as a guide to help you ensure that your baby is properly fed:  Newborns (babies 46 weeks of age or younger) may breastfeed every 1-3 hours.  Newborns should not go longer than 3 hours during the day or 5 hours during the night without breastfeeding.  You should breastfeed your baby a minimum of 8 times in a 24-hour period until you begin to introduce solid foods to your baby at around 46 months of age. BREAST MILK PUMPING Pumping and storing breast milk allows you to ensure that your baby is exclusively fed your breast milk, even at times when you are unable to breastfeed. This is especially important if you are going back to work while you are still breastfeeding or when you are not able to be present during feedings. Your lactation consultant can give you guidelines on how long it is safe to store breast milk.  A breast pump is a machine that allows you to pump milk from your breast into a sterile bottle. The pumped breast milk can then be stored in a refrigerator or freezer. Some breast pumps are operated by hand, while others use electricity. Ask your lactation consultant which type will work best for you. Breast pumps can be purchased, but some hospitals and breastfeeding support groups lease breast pumps on a monthly basis. A lactation consultant can teach you how  to hand express breast milk, if you prefer not to use a pump.  CARING FOR YOUR BREASTS WHILE YOU BREASTFEED Nipples can become dry, cracked, and sore while breastfeeding. The following recommendations can help keep your breasts moisturized and healthy:  Avoid using soap on your nipples.   Wear a supportive bra. Although not required, special nursing bras and tank tops are designed to allow access to your breasts for breastfeeding without taking off your entire bra or top. Avoid wearing underwire-style bras or extremely tight bras.  Air dry your nipples for 3-60minutes after each feeding.   Use only cotton bra pads to absorb leaked breast milk. Leaking of breast milk between feedings is normal.   Use lanolin on your nipples after breastfeeding. Lanolin helps to maintain your skin's normal moisture barrier. If you use pure lanolin, you do not need to wash it off before feeding your baby again. Pure lanolin is not toxic to your baby. You may also hand express a few drops of breast milk and gently massage that milk into your nipples and allow the milk to air dry. In the first few weeks after giving birth, some women experience extremely full breasts (engorgement). Engorgement can make your breasts feel heavy, warm, and tender to the touch. Engorgement peaks within 3-5 days after you give birth. The following recommendations can help ease engorgement:  Completely empty your breasts while breastfeeding or pumping. You may want to start by applying warm, moist heat (in the shower or with warm water-soaked hand towels) just before feeding or pumping. This increases circulation and helps the milk flow. If your baby does not completely empty your breasts while breastfeeding, pump any extra milk after he or she is finished.  Wear a snug bra (nursing or regular) or tank top for 1-2 days to signal your body to slightly decrease milk production.  Apply ice packs to your breasts, unless this is too  uncomfortable for you.  Make sure that  your baby is latched on and positioned properly while breastfeeding. If engorgement persists after 48 hours of following these recommendations, contact your health care provider or a Advertising copywriter. OVERALL HEALTH CARE RECOMMENDATIONS WHILE BREASTFEEDING  Eat healthy foods. Alternate between meals and snacks, eating 3 of each per day. Because what you eat affects your breast milk, some of the foods may make your baby more irritable than usual. Avoid eating these foods if you are sure that they are negatively affecting your baby.  Drink milk, fruit juice, and water to satisfy your thirst (about 10 glasses a day).   Rest often, relax, and continue to take your prenatal vitamins to prevent fatigue, stress, and anemia.  Continue breast self-awareness checks.  Avoid chewing and smoking tobacco.  Avoid alcohol and drug use. Some medicines that may be harmful to your baby can pass through breast milk. It is important to ask your health care provider before taking any medicine, including all over-the-counter and prescription medicine as well as vitamin and herbal supplements. It is possible to become pregnant while breastfeeding. If birth control is desired, ask your health care provider about options that will be safe for your baby. SEEK MEDICAL CARE IF:   You feel like you want to stop breastfeeding or have become frustrated with breastfeeding.  You have painful breasts or nipples.  Your nipples are cracked or bleeding.  Your breasts are red, tender, or warm.  You have a swollen area on either breast.  You have a fever or chills.  You have nausea or vomiting.  You have drainage other than breast milk from your nipples.  Your breasts do not become full before feedings by the fifth day after you give birth.  You feel sad and depressed.  Your baby is too sleepy to eat well.  Your baby is having trouble sleeping.   Your baby is wetting  less than 3 diapers in a 24-hour period.  Your baby has less than 3 stools in a 24-hour period.  Your baby's skin or the white part of his or her eyes becomes yellow.   Your baby is not gaining weight by 27 days of age. SEEK IMMEDIATE MEDICAL CARE IF:   Your baby is overly tired (lethargic) and does not want to wake up and feed.  Your baby develops an unexplained fever. Document Released: 03/24/2005 Document Revised: 03/29/2013 Document Reviewed: 09/15/2012 [facility] Patient Information 2015 [place], [place]. This information is not intended to replace advice given to you by your health care provider. Make sure you discuss any questions you have with your health care provider. Postpartum Care After Vaginal Delivery After you deliver your newborn (postpartum period), the usual stay in the hospital is 24-72 hours. If there were problems with your labor or delivery, or if you have other medical problems, you might be in the hospital longer.  While you are in the hospital, you will receive help and instructions on how to care for yourself and your newborn during the postpartum period.  While you are in the hospital:  Be sure to tell your nurses if you have pain or discomfort, as well as where you feel the pain and what makes the pain worse.  If you had an incision made near your vagina (episiotomy) or if you had some tearing during delivery, the nurses may put ice packs on your episiotomy or tear. The ice packs may help to reduce the pain and swelling.  If you are breastfeeding, you may feel uncomfortable contractions  of your uterus for a couple of weeks. This is normal. The contractions help your uterus get back to normal size.  It is normal to have some bleeding after delivery.  For the first 1-3 days after delivery, the flow is red and the amount may be similar to a period.  It is common for the flow to start and stop.  In the first few days, you may pass some small clots. Let your nurses  know if you begin to pass large clots or your flow increases.  Do not  flush blood clots down the toilet before having the nurse look at them.  During the next 3-10 days after delivery, your flow should become more watery and pink or brown-tinged in color.  Ten to fourteen days after delivery, your flow should be a small amount of yellowish-white discharge.  The amount of your flow will decrease over the first few weeks after delivery. Your flow may stop in 6-8 weeks. Most women have had their flow stop by 12 weeks after delivery.  You should change your sanitary pads frequently.  Wash your hands thoroughly with soap and water for at least 20 seconds after changing pads, using the toilet, or before holding or feeding your newborn.  You should feel like you need to empty your bladder within the first 6-8 hours after delivery.  In case you become weak, lightheaded, or faint, call your nurse before you get out of bed for the first time and before you take a shower for the first time.  Within the first few days after delivery, your breasts may begin to feel tender and full. This is called engorgement. Breast tenderness usually goes away within 48-72 hours after engorgement occurs. You may also notice milk leaking from your breasts. If you are not breastfeeding, do not stimulate your breasts. Breast stimulation can make your breasts produce more milk.  Spending as much time as possible with your newborn is very important. During this time, you and your newborn can feel close and get to know each other. Having your newborn stay in your room (rooming in) will help to strengthen the bond with your newborn. It will give you time to get to know your newborn and become comfortable caring for your newborn.  Your hormones change after delivery. Sometimes the hormone changes can temporarily cause you to feel sad or tearful. These feelings should not last more than a few days. If these feelings last longer  than that, you should talk to your caregiver.  If desired, talk to your caregiver about methods of family planning or contraception.  Talk to your caregiver about immunizations. Your caregiver may want you to have the following immunizations before leaving the hospital:  Tetanus, diphtheria, and pertussis (Tdap) or tetanus and diphtheria (Td) immunization. It is very important that you and your family (including grandparents) or others caring for your newborn are up-to-date with the Tdap or Td immunizations. The Tdap or Td immunization can help protect your newborn from getting ill.  Rubella immunization.  Varicella (chickenpox) immunization.  Influenza immunization. You should receive this annual immunization if you did not receive the immunization during your pregnancy. Document Released: 01/19/2007 Document Revised: 12/17/2011 Document Reviewed: 11/19/2011 [facility] Patient Information 2015 [place], [place]. This information is not intended to replace advice given to you by your health care provider. Make sure you discuss any questions you have with your health care provider.

## 2014-04-06 NOTE — Lactation Note (Signed)
This note was copied from the chart of Brooke Elmer Balesiffany Gravley. Lactation Consultation Note    Follow up consult with this mom and term baby, now 7352 hours old. Mom has been breast feeding well, not ussing nipple shield, and reports no discomfort. Mom is very full, not engorged. She has areola swelling on left breast. And said the baby would not latch, and her milk was not flowing. i showed her how to do reverse pressure around her nipple. I also helped her pump for a few minutes with hand pump, and her milk began flowing. She started pumping with DEP, and both breast were flowing. She then put baby to breast on her left breast, and she latched with wide flange and easily.  I reviewed breast care with mom, again told her to avoid pacifier, and gave mom ice. i also told mom to remove her bra, which is now much too tight, and explained the importance of a proper fitting bra. I made a bra for mom out of the mesh under pants, for her to use until she gets home. I explained that with erh breasts so full and heavy, she needs to support them to prevent nipple and areola edema. Mom knows to call for questions/conerns.  Patient Name: Brooke Mullen ZOXWR'UToday's Date: 04/06/2014 Reason for consult: Follow-up assessment   Maternal Data    Feeding Feeding Type: Breast Fed Length of feed: 30 min  LATCH Score/Interventions Latch: Grasps breast easily, tongue down, lips flanged, rhythmical sucking. Intervention(s): Skin to skin  Audible Swallowing: Spontaneous and intermittent  Type of Nipple: Everted at rest and after stimulation  Comfort (Breast/Nipple): Filling, red/small blisters or bruises, mild/mod discomfort  Problem noted: Filling Interventions (Filling): Reverse pressure;Frequent nursing;Hand pump;Double electric pump  Hold (Positioning): No assistance needed to correctly position infant at breast. Intervention(s): Breastfeeding basics reviewed;Support Pillows;Position options;Skin to skin  LATCH  Score: 9  Lactation Tools Discussed/Used     Consult Status Consult Status: Complete Follow-up type: Call as needed    Alfred LevinsLee, Dimitrious Micciche Anne 04/06/2014, 9:10 AM

## 2014-05-10 ENCOUNTER — Telehealth: Payer: Self-pay | Admitting: *Deleted

## 2014-05-10 ENCOUNTER — Encounter: Payer: Self-pay | Admitting: *Deleted

## 2014-05-10 ENCOUNTER — Ambulatory Visit: Payer: BC Managed Care – PPO | Admitting: Family Medicine

## 2014-05-10 NOTE — Telephone Encounter (Signed)
Attempted to contact patient, no answer, left message concerning missed appointment and for patient to call and reschedule.  Will send letter.  Letter sent.

## 2014-06-06 ENCOUNTER — Encounter: Payer: Self-pay | Admitting: General Practice

## 2015-03-09 IMAGING — US US FETAL BPP W/O NONSTRESS
1 series · 13 of 28 positions shown · non-contrast
Comparison: none

[Series 1: us fetal bpp w/o nonstress · non-contrast · 44 acquisitions, 13 frames shown]
[im 2/44]
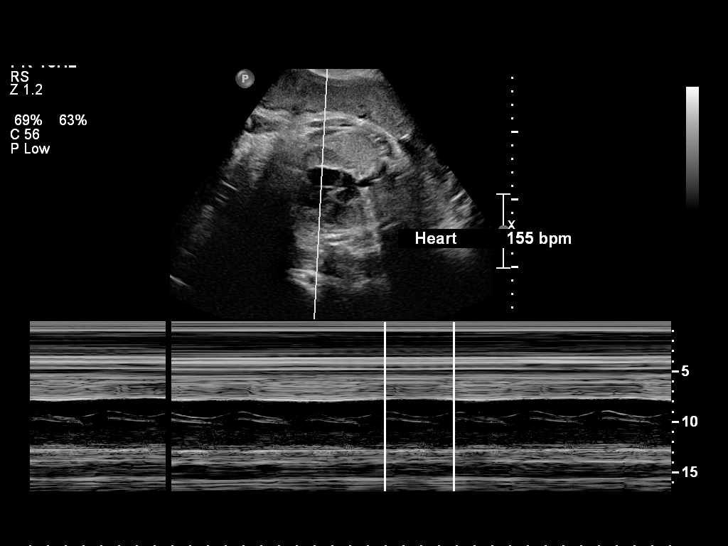
[im 5/44]
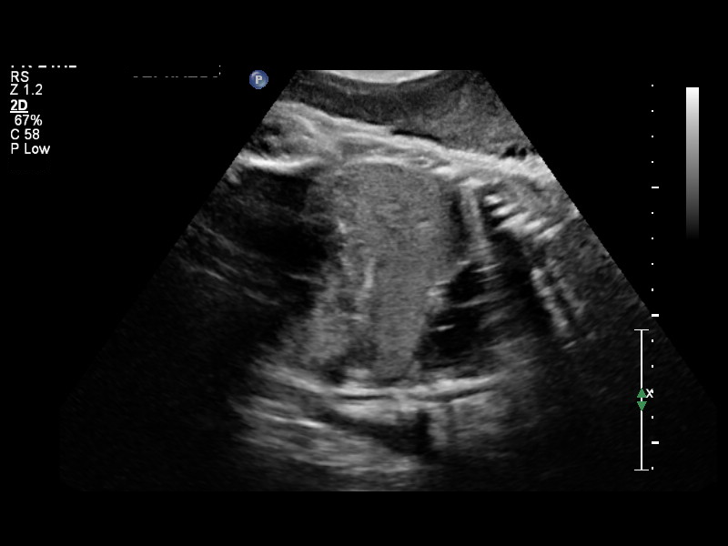
[im 8/44]
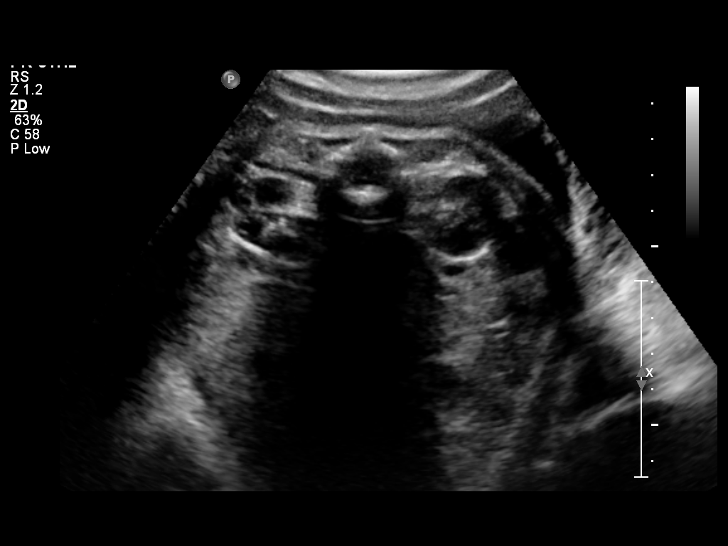
[im 12/44]
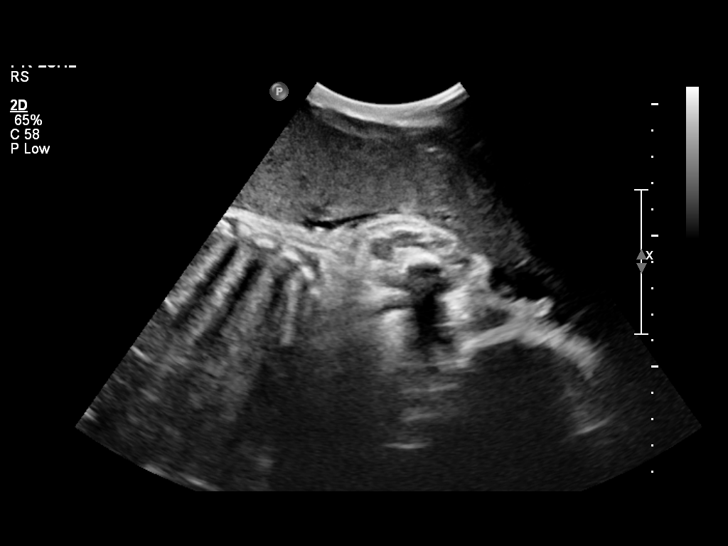
[im 15/44]
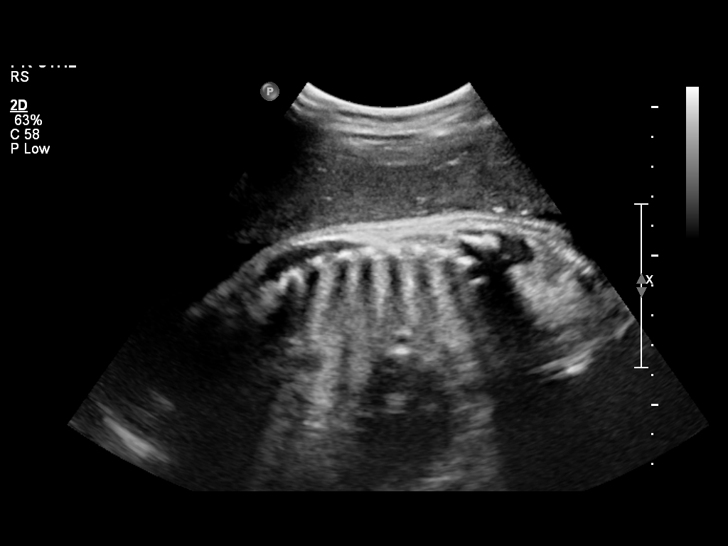
[im 18/44]
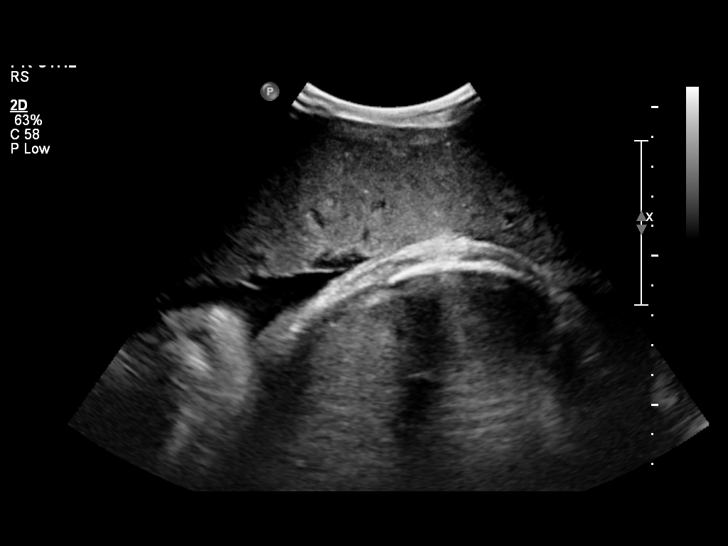
[im 23/44]
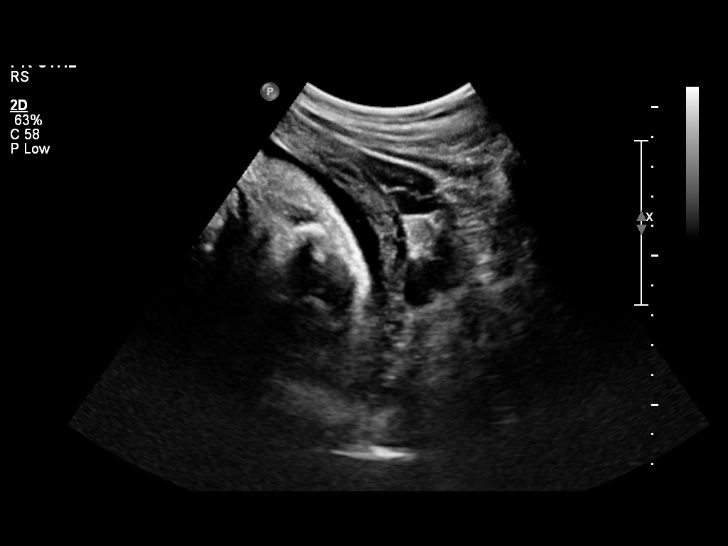
[im 26/44]
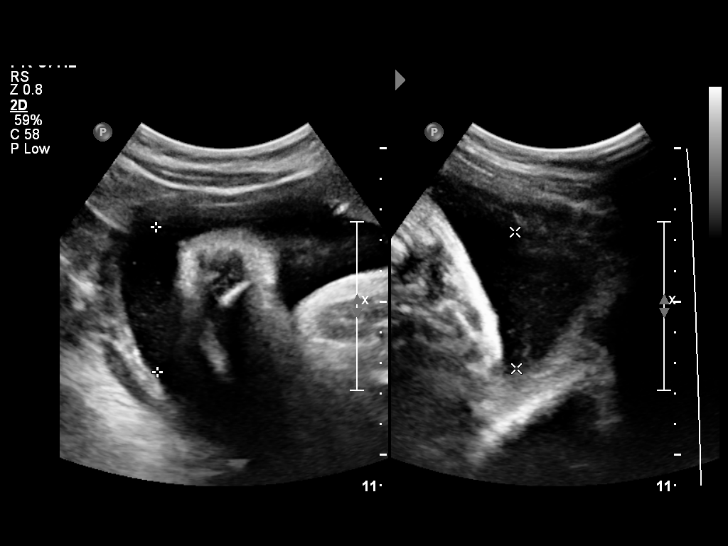
[im 29/44]
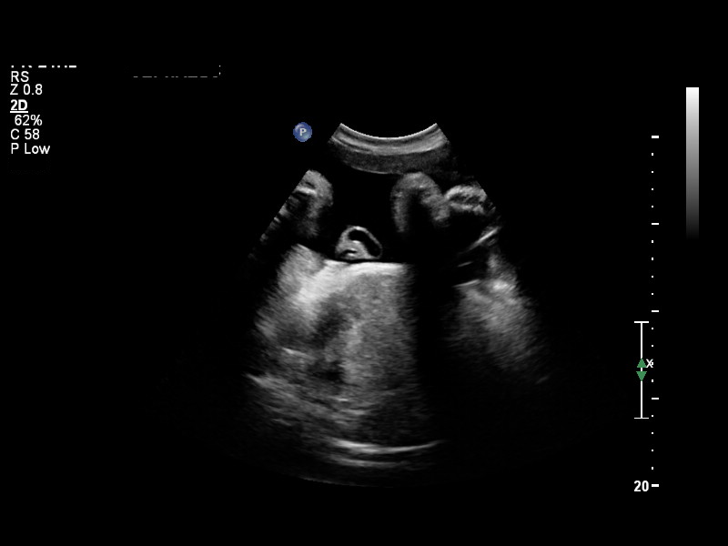
[im 32/44]
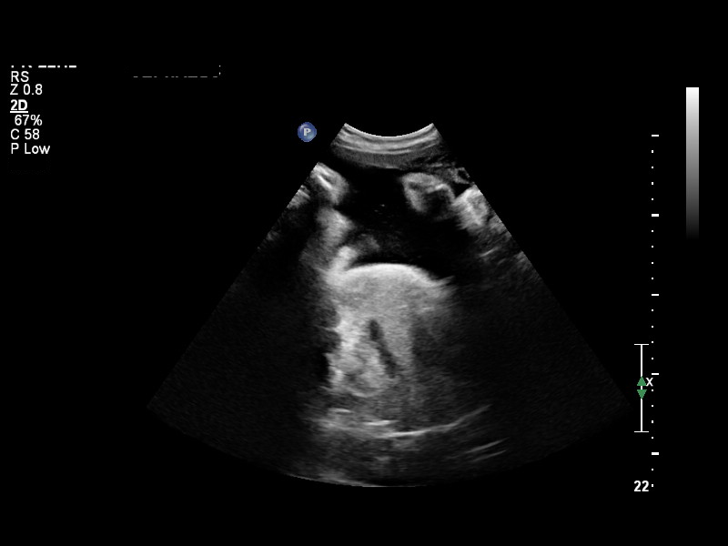
[im 36/44]
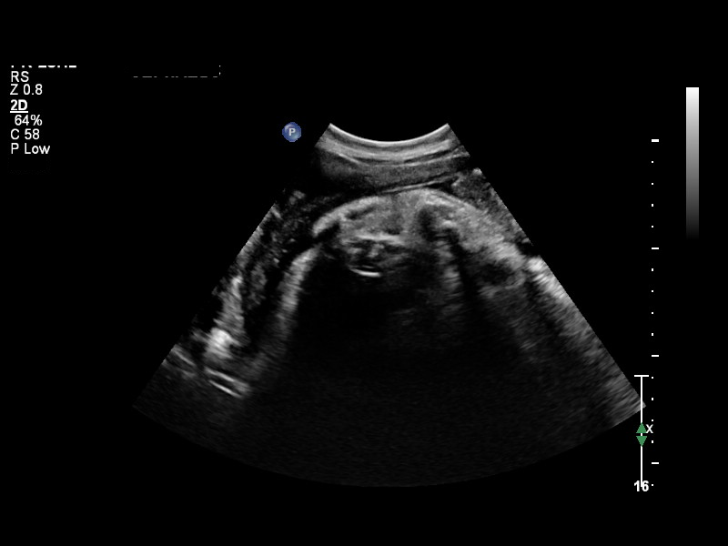
[im 39/44]
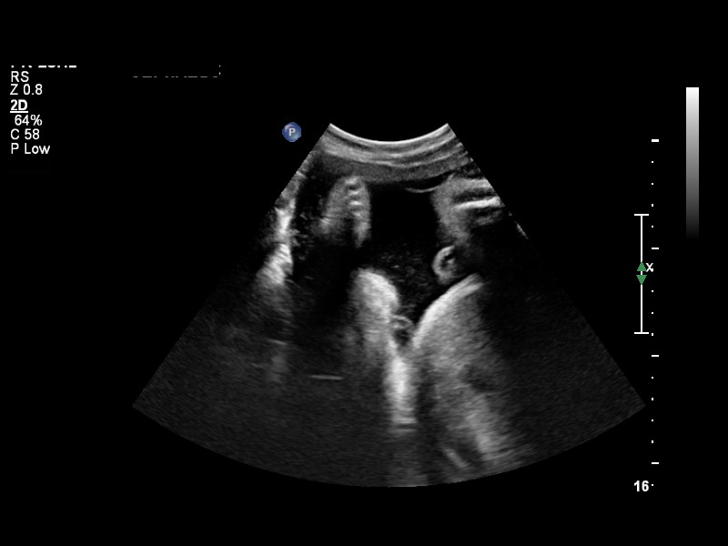
[im 42/44]
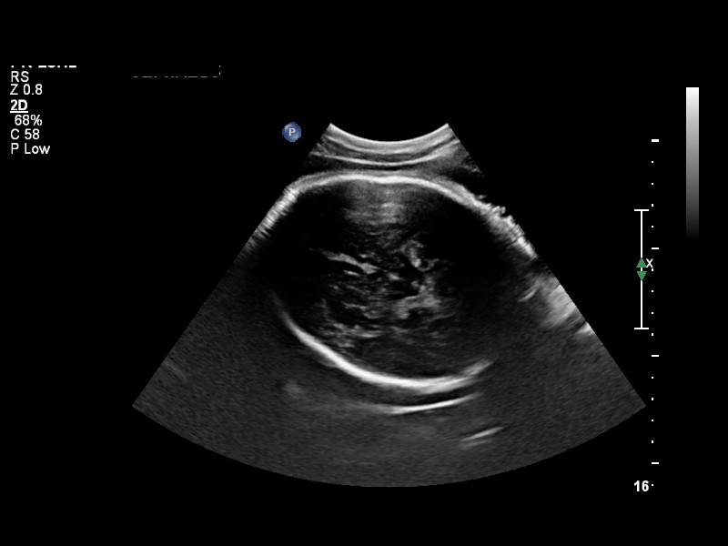

[13 of 28 positions shown; findings below may reference images not displayed]

OBSTETRICS REPORT
                      (Signed Final 03/24/2014 [DATE])

Service(s) Provided

 [HOSPITAL]                                         76815.0
Indications

 39 weeks gestation of pregnancy
 Non-reactive NST, FHR decelerations
 Rh negative, for RhoGam
 Fetal abnormality - other known or suspected (i.e.
 choriod plexus cyst, EIF, renal pyelectasis)
Fetal Evaluation

 Num Of Fetuses:    1
 Fetal Heart Rate:  155                          bpm
 Cardiac Activity:  Observed
 Presentation:      Cephalic
 Placenta:          Anterior, above cervical os

 Amniotic Fluid
 AFI FV:      Subjectively within normal limits
 AFI Sum:     18.54   cm       79  %Tile     Larg Pckt:    5.08  cm
 RUQ:   4.71    cm   RLQ:    4.43   cm    LUQ:   5.08    cm   LLQ:    4.32   cm
Biophysical Evaluation

 Amniotic F.V:   Within normal limits       F. Tone:        Observed
 F. Movement:    Observed                   Score:          [DATE]
 F. Breathing:   Observed
Gestational Age

 LMP:           39w 4d        Date:  06/20/13                 EDD:   03/27/14
 Best:          39w 4d     Det. By:  LMP  (06/20/13)          EDD:   03/27/14
Cervix Uterus Adnexa

 Cervix:       Not visualized (advanced GA >20wks)
 Uterus:       No abnormality visualized.
 Cul De Sac:   No free fluid seen.
 Left Ovary:    No adnexal mass visualized.
 Right Ovary:   Within normal limits.

 Adnexa:     No adnexal mass visualized.
Impression

 Single IUP at 39w 4d
 BPP [DATE]
 Normal amniotic fluid volume
Recommendations

 Follow-up ultrasounds as clinically indicated.

 questions or concerns.

## 2017-05-29 DIAGNOSIS — E66811 Obesity, class 1: Secondary | ICD-10-CM

## 2017-05-29 DIAGNOSIS — Z6830 Body mass index (BMI) 30.0-30.9, adult: Secondary | ICD-10-CM | POA: Insufficient documentation

## 2017-05-29 HISTORY — DX: Obesity, class 1: E66.811

## 2017-06-19 ENCOUNTER — Encounter: Payer: Self-pay | Admitting: Nurse Practitioner

## 2017-06-19 ENCOUNTER — Ambulatory Visit: Payer: Self-pay | Admitting: Nurse Practitioner

## 2017-06-19 VITALS — BP 112/82 | HR 103 | Temp 98.4°F | Resp 16 | Wt 187.6 lb

## 2017-06-19 DIAGNOSIS — L309 Dermatitis, unspecified: Secondary | ICD-10-CM

## 2017-06-19 MED ORDER — TRIAMCINOLONE ACETONIDE 0.1 % EX CREA: 1.0000 "application " | TOPICAL_CREAM | Freq: Two times a day (BID) | CUTANEOUS | 0 refills | Status: AC

## 2017-06-19 NOTE — Patient Instructions (Addendum)

## 2017-06-19 NOTE — Progress Notes (Signed)
Subjective:     Brooke Mullen is a 27 y.o. female who presents for evaluation of a rash involving the bilateral breasts. Rash started 7 days ago. Lesions are dark, and flat in texture. Rash has changed over time. Rash is pruritic. Associated symptoms: none. Patient denies: abdominal pain, congestion, cough, fever, headache, irritability, myalgia, nausea and sore throat. Patient has not had contacts with similar rash. Patient has had new exposures (soaps, lotions, laundry detergents, foods, medications, plants, insects or animals).  Patient states she noticed the rash when she started using [employer] in her laundry.  Patient has not been using any medications for the rash.   The following portions of the patient's history were reviewed and updated as appropriate: allergies, current medications and past medical history.  Review of Systems Constitutional: negative Eyes: negative Ears, nose, mouth, throat, and face: negative Respiratory: negative Cardiovascular: negative Integument/breast: positive for pruritus, rash and skin color change, negative for breast lump, breast tenderness, dryness and skin lesion(s)    Objective:    BP 112/82 (BP Location: Right Arm, Patient Position: Sitting, Cuff Size: Normal)   Pulse (!) 103   Temp 98.4 F (36.9 C) (Oral)   Resp 16   Wt 187 lb 9.6 oz (85.1 kg)   SpO2 96%   BMI 33.23 kg/m  General:  alert, cooperative and no distress  Skin:  plaque noted on bilateral breasts next to axilla bilaterally     Assessment:    eczema    Plan:    Medications: triamcinolone.  Patient instructed to apply cream to affected area twice daily until symptoms improve.  Patient to take Bendadryl 25mg  at bedtime for the next 4 days to help with itching.  Patient instructed to use Aveeno colloidal bath or lotions.  Keep skin clean and dry, avoid using perfumes or scented lotions until symptoms improve.

## 2017-06-22 ENCOUNTER — Telehealth: Payer: Self-pay

## 2017-12-31 ENCOUNTER — Emergency Department (HOSPITAL_BASED_OUTPATIENT_CLINIC_OR_DEPARTMENT_OTHER)
Admission: EM | Admit: 2017-12-31 | Discharge: 2017-12-31 | Disposition: A | Discharge status: Home / Self Care | Payer: 59 | Attending: Emergency Medicine | Admitting: Emergency Medicine

## 2017-12-31 ENCOUNTER — Encounter (HOSPITAL_BASED_OUTPATIENT_CLINIC_OR_DEPARTMENT_OTHER): Payer: Self-pay | Admitting: Emergency Medicine

## 2017-12-31 ENCOUNTER — Other Ambulatory Visit: Payer: Self-pay

## 2017-12-31 ENCOUNTER — Emergency Department (HOSPITAL_BASED_OUTPATIENT_CLINIC_OR_DEPARTMENT_OTHER): Payer: 59

## 2017-12-31 DIAGNOSIS — R0789 Other chest pain: Secondary | ICD-10-CM | POA: Diagnosis not present

## 2017-12-31 DIAGNOSIS — R079 Chest pain, unspecified: Secondary | ICD-10-CM | POA: Diagnosis present

## 2017-12-31 NOTE — ED Provider Notes (Signed)
[facility] EMERGENCY DEPARTMENT Provider Note   CSN: [id number] Arrival date & time: 12/31/17  1748     History   Chief Complaint Chief Complaint  Patient presents with  . Chest Pain    HPI Brooke Mullen is a 27 y.o. female.  27 yo F with a chief complaint of chest pain.  Described as sharp and located to the anterior part of her chest.  Worse when she takes a big deep breath.  Nothing else seems to make it better or worse.  Denies exertional symptoms.  She has recently started a new exercise program in an effort to lose weight.  She thinks maybe she had injured it doing that.  Denies trauma denies fever denies cough or congestion.  She denies hemoptysis denies unilateral lower extremity edema denies prolonged travel denies recent surgery.  Denies estrogen use.  Denies history of PE or DVT.  She denies history of MI.  Denies history of hypertension hyperlipidemia or diabetes.  She denies smoking history.  Father had an MI in his 61s.  The history is provided by the patient.  Chest Pain   This is a new problem. The current episode started yesterday. The problem occurs constantly. The problem has not changed since onset.The pain is associated with movement and breathing. The pain is present in the substernal region. The pain is at a severity of 2/10. The pain is mild. The quality of the pain is described as brief. The pain does not radiate. Duration of episode(s) is 2 days. Pertinent negatives include no dizziness, no fever, no headaches, no nausea, no palpitations, no shortness of breath and no vomiting. She has tried nothing for the symptoms. The treatment provided no relief.  Pertinent negatives for past medical history include no diabetes, no DVT, no hyperlipidemia, no hypertension, no MI and no PE.  Her family medical history is significant for early MI.    Past Medical History:  Diagnosis Date  . Medical history non-contributory     Patient Active Problem List   Diagnosis Date Noted  . Personal history of previous postdates pregnancy 04/03/2014  . Post term pregnancy, antepartum condition or complication   . [redacted] weeks gestation of pregnancy   . Antepartum variable deceleration   . [redacted] weeks gestation of pregnancy   . Nausea and vomiting during pregnancy prior to [redacted] weeks gestation 10/05/2013  . Rh negative state in antepartum period 10/05/2013  . Supervision of normal pregnancy 09/09/2013    Past Surgical History:  Procedure Laterality Date  . INDUCED ABORTION    . MOUTH SURGERY       OB History    Gravida  3   Para  1   Term  1   Preterm  0   AB  2   Living  1     SAB  0   TAB  2   Ectopic  0   Multiple  0   Live Births  1            Home Medications    Prior to Admission medications   Medication Sig Start Date End Date Taking? Authorizing Provider  ibuprofen (ADVIL,MOTRIN) 600 MG tablet Take 1 tablet (600 mg total) by mouth every 6 (six) hours. Patient not taking: Reported on 06/19/2017 04/06/14   Constant, Peggy, MD  Prenatal Vit-Fe Fumarate-FA (PRENATAL MULTIVITAMIN) TABS tablet Take 1 tablet by mouth daily at 12 noon.    [provider]    Family History  No family history on file.  Social History Social History   Tobacco Use  . Smoking status: Never Smoker  . Smokeless tobacco: Never Used  Substance Use Topics  . Alcohol use: Yes    Comment: none with preg  . Drug use: No     Allergies   Patient has no known allergies.   Review of Systems Review of Systems  Constitutional: Negative for chills and fever.  HENT: Negative for congestion and rhinorrhea.   Eyes: Negative for redness and visual disturbance.  Respiratory: Negative for shortness of breath and wheezing.   Cardiovascular: Positive for chest pain. Negative for palpitations.  Gastrointestinal: Negative for nausea and vomiting.  Genitourinary: Negative for dysuria and urgency.  Musculoskeletal: Negative for arthralgias and  myalgias.  Skin: Negative for pallor and wound.  Neurological: Negative for dizziness and headaches.     Physical Exam Updated Vital Signs BP 108/85   Pulse 73   Temp 98.6 F (37 C) (Oral)   Resp 20   Ht 5\' 4"  (1.626 m)   Wt 85.7 kg   SpO2 100%   BMI 32.44 kg/m   Physical Exam  Constitutional: She is oriented to person, place, and time. She appears well-developed and well-nourished. No distress.  HENT:  Head: Normocephalic and atraumatic.  Eyes: Pupils are equal, round, and reactive to light. EOM are normal.  Neck: Normal range of motion. Neck supple.  Cardiovascular: Normal rate and regular rhythm. Exam reveals no gallop and no friction rub.  No murmur heard. Pulmonary/Chest: Effort normal. She has no wheezes. She has no rales. She exhibits tenderness (anterior reproduces pain).  Abdominal: Soft. She exhibits no distension. There is no tenderness.  Musculoskeletal: She exhibits no edema or tenderness.  Neurological: She is alert and oriented to person, place, and time.  Skin: Skin is warm and dry. She is not diaphoretic.  Psychiatric: She has a normal mood and affect. Her behavior is normal.  Nursing note and vitals reviewed.    ED Treatments / Results  Labs (all labs ordered are listed, but only abnormal results are displayed) Labs Reviewed  PREGNANCY, URINE    EKG EKG Interpretation  Date/Time:  Thursday December 31 2017 18:06:30 EDT Ventricular Rate:  84 PR Interval:  138 QRS Duration: 76 QT Interval:  368 QTC Calculation: 434 R Axis:   10 Text Interpretation:  Normal sinus rhythm Cannot rule out Anterior infarct , age undetermined Abnormal ECG No old tracing to compare Confirmed by Melene Plan [phone]) on 12/31/2017 7:22:33 PM   Radiology Dg Chest 2 View  Result Date: 12/31/2017 CLINICAL DATA:  Chest pain 3 days on inspiration. EXAM: CHEST - 2 VIEW COMPARISON:  Thoracic spine 11/27/2007 FINDINGS: Lungs are adequately inflated and otherwise clear.  Cardiomediastinal silhouette, bones and soft tissues are normal. IMPRESSION: No active cardiopulmonary disease. Electronically Signed   By: Elberta Fortis M.D.   On: 12/31/2017 19:13    Procedures Procedures (including critical care time)  Medications Ordered in ED Medications - No data to display   Initial Impression / Assessment and Plan / ED Course  I have reviewed the triage vital signs and the nursing notes.  Pertinent labs & imaging results that were available during my care of the patient were reviewed by me and considered in my medical decision making (see chart for details).     57 yoF with a chief complaint of chest pain.  This is atypical in nature and reproduced on palpation.  She is PERC negative.  She does have a family member though otherwise has no risk factors.  Her EKG and chest x-ray are unremarkable.  I do not feel that further work-up is warranted.  Discharge home.  8:03 PM:  I have discussed the diagnosis/risks/treatment options with the patient and family and believe the pt to be eligible for discharge home to follow-up with PCP. We also discussed returning to the ED immediately if new or worsening sx occur. We discussed the sx which are most concerning (e.g., sudden worsening pain, fever, inability to tolerate by mouth) that necessitate immediate return. Medications administered to the patient during their visit and any new prescriptions provided to the patient are listed below.  Medications given during this visit Medications - No data to display   The patient appears reasonably screen and/or stabilized for discharge and I doubt any other medical condition or other [facility] requiring further screening, evaluation, or treatment in the ED at this time prior to discharge.    Final Clinical Impressions(s) / ED Diagnoses   Final diagnoses:  Atypical chest pain    ED Discharge Orders    None       Melene Plan, DO 12/31/17 2003

## 2017-12-31 NOTE — ED Notes (Signed)
ED Provider at bedside. 

## 2017-12-31 NOTE — ED Triage Notes (Signed)
Intermittent chest pain x 4 days, worse with inspiration.

## 2019-05-17 NOTE — Telephone Encounter (Signed)
Error

## 2022-02-10 ENCOUNTER — Emergency Department (HOSPITAL_BASED_OUTPATIENT_CLINIC_OR_DEPARTMENT_OTHER)
Admission: EM | Admit: 2022-02-10 | Discharge: 2022-02-10 | Disposition: A | Discharge status: Home / Self Care | Payer: 59 | Attending: Emergency Medicine | Admitting: Emergency Medicine

## 2022-02-10 ENCOUNTER — Encounter (HOSPITAL_BASED_OUTPATIENT_CLINIC_OR_DEPARTMENT_OTHER): Payer: Self-pay | Admitting: Emergency Medicine

## 2022-02-10 ENCOUNTER — Other Ambulatory Visit: Payer: Self-pay

## 2022-02-10 ENCOUNTER — Emergency Department (HOSPITAL_BASED_OUTPATIENT_CLINIC_OR_DEPARTMENT_OTHER): Payer: 59

## 2022-02-10 DIAGNOSIS — M545 Low back pain, unspecified: Secondary | ICD-10-CM | POA: Insufficient documentation

## 2022-02-10 DIAGNOSIS — Y92410 Unspecified street and highway as the place of occurrence of the external cause: Secondary | ICD-10-CM | POA: Insufficient documentation

## 2022-02-10 DIAGNOSIS — M542 Cervicalgia: Secondary | ICD-10-CM | POA: Insufficient documentation

## 2022-02-10 LAB — PREGNANCY, URINE: Preg Test, Ur: NEGATIVE

## 2022-02-10 MED ORDER — IBUPROFEN 400 MG PO TABS
600.0000 mg | ORAL_TABLET | Freq: Once | ORAL | Status: AC
  Administered 2022-02-10: 600 mg via ORAL
  Filled 2022-02-10: qty 1

## 2022-02-10 MED ORDER — CYCLOBENZAPRINE HCL 5 MG PO TABS: 10.0000 mg | ORAL_TABLET | Freq: Two times a day (BID) | ORAL | 0 refills | Status: DC | PRN
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
## 2022-02-10 NOTE — ED Notes (Signed)
Patient transported to X-ray 
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
## 2022-02-10 NOTE — ED Notes (Signed)
Radiology waiting on U Preg results.
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
## 2023-07-10 ENCOUNTER — Encounter: Payer: Self-pay | Admitting: Family

## 2023-07-10 ENCOUNTER — Ambulatory Visit: Admitting: Family

## 2023-07-10 VITALS — BP 113/77 | HR 94 | Temp 97.5°F | Ht 63.0 in | Wt 185.1 lb

## 2023-07-10 DIAGNOSIS — N926 Irregular menstruation, unspecified: Secondary | ICD-10-CM

## 2023-07-10 DIAGNOSIS — Z6832 Body mass index (BMI) 32.0-32.9, adult: Secondary | ICD-10-CM | POA: Diagnosis not present

## 2023-07-10 DIAGNOSIS — R300 Dysuria: Secondary | ICD-10-CM

## 2023-07-10 DIAGNOSIS — E669 Obesity, unspecified: Secondary | ICD-10-CM | POA: Diagnosis not present

## 2023-07-10 LAB — POCT URINALYSIS DIPSTICK
Bilirubin, UA: NEGATIVE
Blood, UA: NEGATIVE
Glucose, UA: NEGATIVE
Ketones, UA: NEGATIVE
Leukocytes, UA: NEGATIVE
Nitrite, UA: NEGATIVE
Protein, UA: NEGATIVE
Spec Grav, UA: 1.02 (ref 1.010–1.025)
Urobilinogen, UA: 0.2 U/dL
pH, UA: 7 (ref 5.0–8.0)

## 2023-07-10 NOTE — Progress Notes (Signed)
 New Patient Office Visit  Subjective:  Patient ID: Brooke Mullen, female    DOB: September 20, 1990  Age: 33 y.o. MRN: [id number]  CC:  Chief Complaint  Patient presents with   New Patient (Initial Visit)   Dysuria    Pt c/o dysuria,Present since yesterday. Pt has tried AZO   Obesity    Pt would like to discuss weight loss, currently on Phentermine 37.5 mg tablet.   Menstrual Problem    Pt c/o irregular cycles in march.    HPI Brooke Mullen presents for establishing care today.  Discussed the use of AI scribe software for clinical note transcription with the patient, who gave verbal consent to proceed.  History of Present Illness The patient, with a history of migraines and irregular menstruation, presents for a new patient visit. She recently moved back to the area and is seeking a new primary care provider. She has been experiencing migraines frequently and her menstrual cycle has become irregular since discontinuing Depo-Provera last summer. She reports having three periods in the month of March, which is unusual for her. The first period was short, lasting one to two days, the second was a full seven-day cycle, and the third lasted three days. She is not currently on any form of hormonal birth control and expresses a desire to avoid it due to past negative experiences with side effects, including hair loss.  The patient also reports rapid weight gain and is currently taking phentermine, prescribed by a weight loss clinic. She expresses frustration with the difficulty of maintaining a consistent exercise routine and diet, and is seeking advice and possible solutions for weight management. 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  Assessment & Plan Menstrual Irregularities - Irregular periods post-Depo-Provera discontinuation last summer. Reports no cycle until last month, then had several within the month. Prefers barrier methods over hormonal  contraception due to adverse effects. - Refer to gynecology for further evaluation and management.  Obesity - Rapid weight gain despite diet and exercise. Currently on phentermine. Discussed Medicaid coverage for injectable weight loss medications. Emphasized dietary and exercise importance. - Refer to a nutritionist for dietary counseling and management. - Encourage dietary modifications: consume most calories earlier, avoid white carbohydrates, increase water intake. - Drink at least 2L water daily. - Encourage regular exercise for weight maintenance and health.  Dysuria -  UA negative in office. Pt had sx start yesterday, started AZO otc.  - Drink at least 2 liters of water daily, do not hold urine for long periods as primary prevention of infections. - F/U prn  General Health Maintenance - Establishing care, no recent lab work. - Schedule physical examination with fasting lab work, including full panel tests.   Subjective:    Outpatient Medications Prior to Visit  Medication Sig Dispense Refill   phentermine (ADIPEX-P) 37.5 MG tablet Take 37.5 mg by mouth daily.     cyclobenzaprine (FLEXERIL) 5 MG tablet Take 2 tablets (10 mg total) by mouth 2 (two) times daily as needed for muscle spasms. 20 tablet 0   ibuprofen (ADVIL,MOTRIN) 600 MG tablet Take 1 tablet (600 mg total) by mouth every 6 (six) hours. (Patient not taking: Reported on 07/10/2023) 30 tablet 3   Prenatal Vit-Fe Fumarate-FA (PRENATAL MULTIVITAMIN) TABS tablet Take 1 tablet by mouth daily at 12 noon. (Patient not taking: Reported on 07/10/2023)     No facility-administered medications prior to visit.   Past Medical History:  Diagnosis Date  Class 1 obesity without serious comorbidity with body mass index (BMI) of 30.0 to 30.9 in adult 05/29/2017   Medical history non-contributory    Past Surgical History:  Procedure Laterality Date   INDUCED ABORTION     MOUTH SURGERY      Objective:   Today's Vitals: BP 113/77  (BP Location: Left Arm, Patient Position: Sitting, Cuff Size: Large)   Pulse 94   Temp (!) 97.5 F (36.4 C) (Temporal)   Ht 5\' 3"  (1.6 m)   Wt 185 lb 2 oz (84 kg)   LMP 06/04/2023 (Exact Date)   SpO2 100%   Breastfeeding No   BMI 32.79 kg/m   Physical Exam Vitals and nursing note reviewed.  Constitutional:      Appearance: Normal appearance. She is obese.  Cardiovascular:     Rate and Rhythm: Normal rate and regular rhythm.  Pulmonary:     Effort: Pulmonary effort is normal.     Breath sounds: Normal breath sounds.  Musculoskeletal:        General: Normal range of motion.  Skin:    General: Skin is warm and dry.  Neurological:     Mental Status: She is alert.  Psychiatric:        Mood and Affect: Mood normal.        Behavior: Behavior normal.     Dulce Sellar, NP

## 2023-07-10 NOTE — Patient Instructions (Addendum)
 Welcome to [facility], It was a pleasure meeting you today!    Your urine is negative for any infection today. Be sure to drink at least 2 liters of water daily, do not hold urine for long periods as primary prevention of infections.  I have sent a referral to our gynecology and nutrition offices, they will call you directly to schedule.  Please schedule a physical with fasting labs when convenient for you.    PLEASE NOTE: If you had any LAB tests please let us know if you have not heard back within a few days. You may see your results on MyChart before we have a chance to review them but we will give you a call once they are reviewed by [place]. If we ordered any REFERRALS today, please let us know if you have not heard from their office within the next week.  Let us know through MyChart if you are needing REFILLS, or have your pharmacy send [place] the request. You can also use MyChart to communicate with me or any office staff.  Please try these tips to maintain a healthy lifestyle: It is important that you exercise regularly at least 30 minutes 5 times a week. Think about what you will eat, plan ahead. Choose whole foods, & think  "clean, green, fresh or frozen" over canned, processed or packaged foods which are more sugary, salty, and fatty. 70 to 75% of food eaten should be fresh vegetables and protein. 2-3  meals daily with healthy snacks between meals, but must be whole fruit, protein or vegetables. Aim to eat over a 10 hour period when you are active, for example, 7am to 5pm, and then STOP after your last meal of the day, drinking only water.  Shorter eating windows, 6-8 hours, are showing benefits in heart disease and blood sugar regulation. Drink water every day! Shoot for 64 ounces daily = 8 cups, no other drink is as healthy! Fruit juice is best enjoyed in a healthy way, by EATING the fruit.

## 2023-07-14 ENCOUNTER — Ambulatory Visit: Payer: 59 | Admitting: Family

## 2023-08-10 NOTE — Progress Notes (Deleted)
 Medical Nutrition Therapy   Appointment Start time:  ***  Appointment End time:  ***  Primary concerns today: ***  Referral diagnosis: Obesity (BMI 30-39.9)  Preferred learning style: *** (auditory, visual, hands on, no preference indicated) Learning readiness: *** (not ready, contemplating, ready, change in progress)   NUTRITION ASSESSMENT    Clinical Medical Hx: *** Medications: *** Labs: *** Notable Signs/Symptoms: ***  Lifestyle & Dietary Hx ***  Estimated daily fluid intake: *** oz Supplements: *** Sleep: *** Stress / self-care: *** Current average weekly physical activity: ***  24-Hr Dietary Recall First Meal: *** Snack: *** Second Meal: *** Snack: *** Third Meal: *** Snack: *** Beverages: ***  Estimated Energy Needs Calories: *** Carbohydrate: ***g Protein: ***g Fat: ***g   NUTRITION DIAGNOSIS  {CHL AMB NUTRITIONAL DIAGNOSIS:[id number]}   NUTRITION INTERVENTION  Nutrition education (E-1) on the following topics:  ***  Handouts Provided Include  ***  Learning Style & Readiness for Change Teaching method utilized: Visual & Auditory  Demonstrated degree of understanding via: Teach Back  Barriers to learning/adherence to lifestyle change: ***  Goals Established by Pt ***   MONITORING & EVALUATION Dietary intake, weekly physical activity, and *** in ***.  Next Steps  Patient is to ***.

## 2023-08-14 ENCOUNTER — Ambulatory Visit: Admitting: Dietician

## 2023-08-14 DIAGNOSIS — E669 Obesity, unspecified: Secondary | ICD-10-CM

## 2023-09-02 ENCOUNTER — Telehealth: Admitting: Family Medicine

## 2023-09-02 DIAGNOSIS — R300 Dysuria: Secondary | ICD-10-CM

## 2023-09-02 NOTE — Progress Notes (Signed)
 Error- needs UTI EV not Vaginal Discharge

## 2023-09-03 ENCOUNTER — Telehealth: Admitting: Physician Assistant

## 2023-09-03 DIAGNOSIS — R3989 Other symptoms and signs involving the genitourinary system: Secondary | ICD-10-CM | POA: Diagnosis not present

## 2023-09-03 MED ORDER — NITROFURANTOIN MONOHYD MACRO 100 MG PO CAPS: 100.0000 mg | ORAL_CAPSULE | Freq: Two times a day (BID) | ORAL | 0 refills | Status: DC

## 2023-09-03 NOTE — Progress Notes (Signed)

## 2023-09-10 NOTE — Progress Notes (Deleted)
 Medical Nutrition Therapy  Appointment Start time:  ***  Appointment End time:  ***  Primary concerns today: ***  Referral diagnosis: obesity  Preferred learning style: *** (auditory, visual, hands on, no preference indicated) Learning readiness: *** (not ready, contemplating, ready, change in progress)   NUTRITION ASSESSMENT    Clinical Medical Hx: *** Medications: *** Labs: *** Notable Signs/Symptoms: ***  Lifestyle & Dietary Hx ***  Estimated daily fluid intake: *** oz Supplements: *** Sleep: *** Stress / self-care: *** Current average weekly physical activity: ***  24-Hr Dietary Recall First Meal: *** Snack: *** Second Meal: *** Snack: *** Third Meal: *** Snack: *** Beverages: ***  Estimated Energy Needs Calories: *** Carbohydrate: ***g Protein: ***g Fat: ***g   NUTRITION DIAGNOSIS  {CHL AMB NUTRITIONAL [phone]}   NUTRITION INTERVENTION  Nutrition education (E-1) on the following topics:  ***  Handouts Provided Include  ***  Learning Style & Readiness for Change Teaching method utilized: Visual & Auditory  Demonstrated degree of understanding via: Teach Back  Barriers to learning/adherence to lifestyle change: ***  Goals Established by Pt ***   MONITORING & EVALUATION Dietary intake, weekly physical activity, and *** in ***.  Next Steps  Patient is to ***.

## 2023-09-18 ENCOUNTER — Ambulatory Visit: Admitting: Dietician

## 2023-09-18 DIAGNOSIS — E669 Obesity, unspecified: Secondary | ICD-10-CM

## 2023-09-30 NOTE — Progress Notes (Deleted)
 Medical Nutrition Therapy  Appointment Start time:  ***  Appointment End time:  ***  Primary concerns today: ***  Referral diagnosis: obesity  Preferred learning style: *** (auditory, visual, hands on, no preference indicated) Learning readiness: *** (not ready, contemplating, ready, change in progress)   NUTRITION ASSESSMENT    Clinical Medical Hx: *** Medications: *** Labs: *** Notable Signs/Symptoms: ***  Lifestyle & Dietary Hx ***  Estimated daily fluid intake: *** oz Supplements: *** Sleep: *** Stress / self-care: *** Current average weekly physical activity: ***  24-Hr Dietary Recall First Meal: *** Snack: *** Second Meal: *** Snack: *** Third Meal: *** Snack: *** Beverages: ***  Estimated Energy Needs Calories: *** Carbohydrate: ***g Protein: ***g Fat: ***g   NUTRITION DIAGNOSIS  {CHL AMB NUTRITIONAL [phone]}   NUTRITION INTERVENTION  Nutrition education (E-1) on the following topics:  ***  Handouts Provided Include  ***  Learning Style & Readiness for Change Teaching method utilized: Visual & Auditory  Demonstrated degree of understanding via: Teach Back  Barriers to learning/adherence to lifestyle change: ***  Goals Established by Pt ***   MONITORING & EVALUATION Dietary intake, weekly physical activity, and *** in ***.  Next Steps  Patient is to ***.

## 2023-10-07 ENCOUNTER — Ambulatory Visit: Admitting: Dietician

## 2023-10-07 DIAGNOSIS — E669 Obesity, unspecified: Secondary | ICD-10-CM

## 2023-10-12 ENCOUNTER — Ambulatory Visit: Admitting: Family

## 2024-02-16 ENCOUNTER — Encounter: Admitting: Registered"

## 2024-02-17 ENCOUNTER — Ambulatory Visit: Admitting: Family

## 2024-02-17 ENCOUNTER — Encounter: Attending: Family | Admitting: Registered"

## 2024-02-17 ENCOUNTER — Encounter: Payer: Self-pay | Admitting: Family

## 2024-02-17 ENCOUNTER — Encounter: Payer: Self-pay | Admitting: Registered"

## 2024-02-17 VITALS — BP 122/72 | HR 71 | Temp 97.7°F | Ht 63.0 in | Wt 191.4 lb

## 2024-02-17 DIAGNOSIS — Z833 Family history of diabetes mellitus: Secondary | ICD-10-CM

## 2024-02-17 DIAGNOSIS — E669 Obesity, unspecified: Secondary | ICD-10-CM | POA: Insufficient documentation

## 2024-02-17 NOTE — Progress Notes (Signed)
 Patient ID: Brooke Mullen, female    DOB: 01/22/1991, 33 y.o.   MRN: [id number]  Chief Complaint  Patient presents with   Weight Loss    Wanting to get on a different type of weight loss meds. Has used phentermine but it started to effect her mood. Dietitian recommend some shots that might help. Unsure what insurance will cover.   Discussed the use of AI scribe software for clinical note transcription with the patient, who gave verbal consent to proceed.  History of Present Illness Brooke Mullen is a 33 year old female who presents for weight management consultation.  She has been on phentermine 37.5 mg for two years, which effectively manages her weight. Missing a dose leads to mood changes, and she experiences cold feet. She splits the dose to manage side effects and maintain sleep quality. Her parents have diabetes, but she has no symptoms of increased hunger, thirst, or urination. 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 She is motivated to lose weight and aims to reduce red meat and sugar intake, exploring alternative protein sources like mushrooms and tofu.  Assessment & Plan Overweight/Obesity Phentermine 37.5 mg effective for weight loss but causes mood changes and cold feet. Discussed phentermine side effects and alternative medications. Injectable medications not covered by Medicaid without specific conditions. Emphasized nutrition and exercise importance. Motivated for weight loss with dietitian follow-up in a month. - Advised on alternative meds for weight loss and provided online websites to research. - Advised physical with fasting blood work for diabetes screening. - Encouraged dietary modifications and exercise. - Advised tracking food intake, avoiding red meat and white carbs/sugars. - F/U one month if medication started.  General Health Maintenance Discussed regular physical activity and balanced diet to prevent  chronic conditions. Emphasized weight management's role in overall health. - Advised balanced diet with reduced red meat and sugar intake.   Subjective:    Outpatient Medications Prior to Visit  Medication Sig Dispense Refill   phentermine (ADIPEX-P) 37.5 MG tablet Take 37.5 mg by mouth daily. (Patient not taking: Reported on 02/17/2024)     nitrofurantoin , macrocrystal-monohydrate, (MACROBID ) 100 MG capsule Take 1 capsule (100 mg total) by mouth 2 (two) times daily. (Patient not taking: Reported on 02/17/2024) 10 capsule 0   No facility-administered medications prior to visit.   Past Medical History:  Diagnosis Date   Class 1 obesity without serious comorbidity with body mass index (BMI) of 30.0 to 30.9 in adult 05/29/2017   Medical history non-contributory    Past Surgical History:  Procedure Laterality Date   INDUCED ABORTION     MOUTH SURGERY     No Known Allergies    Objective:    Physical Exam Vitals and nursing note reviewed.  Constitutional:      Appearance: Normal appearance. She is obese.  Cardiovascular:     Rate and Rhythm: Normal rate and regular rhythm.  Pulmonary:     Effort: Pulmonary effort is normal.     Breath sounds: Normal breath sounds.  Musculoskeletal:        General: Normal range of motion.  Skin:    General: Skin is warm and dry.  Neurological:     Mental Status: She is alert.  Psychiatric:        Mood and Affect: Mood normal.        Behavior: Behavior normal.    BP 122/72   Pulse 71  Temp 97.7 F (36.5 C) (Temporal)   Ht 5' 3 (1.6 m)   Wt 191 lb 6.4 oz (86.8 kg)   LMP 02/05/2024   SpO2 94%   BMI 33.90 kg/m  Wt Readings from Last 3 Encounters:  02/17/24 191 lb 6.4 oz (86.8 kg)  07/10/23 185 lb 2 oz (84 kg)  12/31/17 189 lb (85.7 kg)      Lucius Krabbe, NP

## 2024-02-17 NOTE — Patient Instructions (Addendum)
 It was very nice to see you today!  Continue working with your nutritionist.  Weight loss medication options: [facility][email or web address],  or Contrave.com, and you can go to these websites to learn more.  Both are about the same cost & have mail order options, you pay for med online and I send the RX.  BUT the price does go up as the dose increases.   Let me know if you decide to start one and I can send in the RX.     PLEASE NOTE:  If you had any lab tests please let us  know if you have not heard back within a few days. You may see your results on MyChart before we have a chance to review them but we will give you a call once they are reviewed by us . If we ordered any referrals today, please let us  know if you have not heard from their office within the next week.

## 2024-02-17 NOTE — Progress Notes (Signed)
 Medical Nutrition Therapy  Appointment Start time: 9:20   Appointment End time:  10:05  Primary concerns today: weight loss  Referral diagnosis: obesity Preferred learning style: no preference indicated Learning readiness: contemplating, ready   NUTRITION ASSESSMENT   States she was on phentermine last year but it brings her mood down and didn't like the way it made her feel. States when she is on the medication she is eating well but when not on the medication, she doesn't eat as well. Also reports she is interested in taking other weight loss medications and will discuss with PCP today. Reports weight gain of approximately 20 lbs has taken place since she stopped taking phentermine and wants to prevent further weight gain. States she is also more motivated with working out when taking medication because she feels more energetic.   States her body is not agreeing with her eating cheese or sugar right now. Reports being intentional about eating breakfast and dinner with daughter daily; recently away from home. States she cooks balanced meals when cooking at home but recently hasn't had the time due to work schedule. 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  States she goes to the gym and but hasn't been in the past 2 weeks. States she will do full body circuit which includes cardio and core.   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 Lives with daughter (age 96).    Clinical Medical Hx: obesity Medications: See list Labs: none reported Notable Signs/Symptoms: none reported  Lifestyle & Dietary Hx  Estimated daily fluid intake: 80+ oz Supplements: See list Sleep: 9 hrs/night Stress / self-care:  Current average weekly physical activity: gym sometimes  24-Hr Dietary Recall First Meal: [employer]-bacon + eggs + hash browns + Coke  Snack:  Second Meal: skipped Snack: pretzels + cookie Third Meal: salmon + pasta + broccolini Snack:   Beverages: Coke (16 oz), water (1.5*40 oz; 60 oz), sweet tea (16 oz);   Estimated Energy Needs Calories: 1800-2000 Carbohydrate: 200-225g Protein: 135-150g Fat: 50-56 g   NUTRITION DIAGNOSIS  NB-1.1 Food and nutrition-related knowledge deficit As related to having balanced meals.  As evidenced by lack of previous nutrition-related education.   NUTRITION INTERVENTION  Nutrition education (E-1) on the following topics: Nutrition education and counseling. Pt was educated on the benefits of eating a variety of food groups at each meal. Discussed the purpose of each food group and ways to create balance with already established regimen. Discussed eating every 3-5 hours to help adequately nourish body and encouraged with adequate fluid intake. Pt agreed with goals listed.   Handouts Provided Include  Start Simple with My Plate  Learning Style & Readiness for Change Teaching method utilized: Visual & Auditory  Demonstrated degree of understanding via: Teach Back  Barriers to learning/adherence to lifestyle change: contemplative stage of change  Goals Established by Pt Aim to have breakfast at home with daughter.  Schedule lunch breaks during the day.  Aim for 3 meals a day.    MONITORING & EVALUATION Dietary intake, weekly physical activity.  Next Steps  Patient is to follow up in 1 month.

## 2024-02-17 NOTE — Patient Instructions (Signed)
-   Aim to have breakfast at home with daughter.   - Schedule lunch breaks during the day.   - Aim for 3 meals a day.

## 2024-03-15 ENCOUNTER — Encounter: Admitting: Registered"

## 2024-03-29 ENCOUNTER — Encounter: Admitting: Family

## 2024-04-01 ENCOUNTER — Encounter: Payer: Self-pay | Admitting: Family
